# Patient Record
Sex: Male | Born: 1965
Health system: Southern US, Community
[De-identification: ages and names within clinical notes are randomized; demographics above are authoritative.]

## PROBLEM LIST (undated history)

## (undated) DIAGNOSIS — K219 Gastro-esophageal reflux disease without esophagitis: Secondary | ICD-10-CM

## (undated) DIAGNOSIS — J45909 Unspecified asthma, uncomplicated: Secondary | ICD-10-CM

## (undated) DIAGNOSIS — T7840XA Allergy, unspecified, initial encounter: Secondary | ICD-10-CM

## (undated) DIAGNOSIS — E785 Hyperlipidemia, unspecified: Secondary | ICD-10-CM

## (undated) DIAGNOSIS — E039 Hypothyroidism, unspecified: Secondary | ICD-10-CM

## (undated) HISTORY — DX: Unspecified asthma, uncomplicated: J45.909

## (undated) HISTORY — DX: Allergy, unspecified, initial encounter: T78.40XA

## (undated) HISTORY — DX: Hyperlipidemia, unspecified: E78.5

## (undated) HISTORY — PX: URETHRA SURGERY: SHX824

## (undated) HISTORY — DX: Hypothyroidism, unspecified: E03.9

## (undated) HISTORY — DX: Gastro-esophageal reflux disease without esophagitis: K21.9

---

## 1999-01-05 ENCOUNTER — Encounter: Admission: RE | Admit: 1999-01-05 | Discharge: 1999-04-05 | Payer: Self-pay | Admitting: Internal Medicine

## 1999-11-16 ENCOUNTER — Encounter: Admission: RE | Admit: 1999-11-16 | Discharge: 2000-02-14 | Payer: Self-pay | Admitting: Internal Medicine

## 2003-01-15 ENCOUNTER — Encounter: Admission: RE | Admit: 2003-01-15 | Discharge: 2003-04-15 | Payer: Self-pay | Admitting: Endocrinology

## 2004-04-08 ENCOUNTER — Ambulatory Visit: Payer: Self-pay | Admitting: Internal Medicine

## 2004-06-13 ENCOUNTER — Emergency Department (HOSPITAL_COMMUNITY): Admission: EM | Admit: 2004-06-13 | Discharge: 2004-06-13 | Payer: Self-pay | Admitting: Family Medicine

## 2004-07-08 ENCOUNTER — Encounter: Admission: RE | Admit: 2004-07-08 | Discharge: 2004-07-08 | Payer: Self-pay | Admitting: Occupational Medicine

## 2004-12-21 ENCOUNTER — Ambulatory Visit: Payer: Self-pay | Admitting: Family Medicine

## 2005-04-22 ENCOUNTER — Ambulatory Visit: Payer: Self-pay | Admitting: Family Medicine

## 2005-06-04 ENCOUNTER — Ambulatory Visit: Payer: Self-pay | Admitting: Family Medicine

## 2005-06-07 ENCOUNTER — Ambulatory Visit: Payer: Self-pay | Admitting: Family Medicine

## 2005-06-08 ENCOUNTER — Ambulatory Visit: Payer: Self-pay | Admitting: Family Medicine

## 2005-06-09 ENCOUNTER — Ambulatory Visit: Payer: Self-pay | Admitting: Family Medicine

## 2005-06-10 ENCOUNTER — Ambulatory Visit: Payer: Self-pay | Admitting: Family Medicine

## 2005-06-11 ENCOUNTER — Ambulatory Visit: Payer: Self-pay | Admitting: Family Medicine

## 2005-08-26 ENCOUNTER — Ambulatory Visit: Payer: Self-pay | Admitting: Family Medicine

## 2005-08-31 ENCOUNTER — Ambulatory Visit: Payer: Self-pay | Admitting: Family Medicine

## 2006-10-21 ENCOUNTER — Encounter: Admission: RE | Admit: 2006-10-21 | Discharge: 2006-10-21 | Payer: Self-pay | Admitting: Occupational Medicine

## 2006-12-09 ENCOUNTER — Ambulatory Visit: Payer: Self-pay | Admitting: Family Medicine

## 2006-12-09 DIAGNOSIS — J45909 Unspecified asthma, uncomplicated: Secondary | ICD-10-CM | POA: Insufficient documentation

## 2006-12-09 DIAGNOSIS — E785 Hyperlipidemia, unspecified: Secondary | ICD-10-CM

## 2006-12-09 DIAGNOSIS — E119 Type 2 diabetes mellitus without complications: Secondary | ICD-10-CM

## 2006-12-09 DIAGNOSIS — E039 Hypothyroidism, unspecified: Secondary | ICD-10-CM

## 2006-12-09 DIAGNOSIS — K219 Gastro-esophageal reflux disease without esophagitis: Secondary | ICD-10-CM

## 2006-12-12 ENCOUNTER — Encounter: Payer: Self-pay | Admitting: Family Medicine

## 2006-12-12 LAB — CONVERTED CEMR LAB
AST: 34 units/L (ref 0–37)
Albumin: 3.9 g/dL (ref 3.5–5.2)
Alkaline Phosphatase: 49 units/L (ref 39–117)
BUN: 11 mg/dL (ref 6–23)
Basophils Absolute: 0 10*3/uL (ref 0.0–0.1)
Bilirubin, Direct: 0.2 mg/dL (ref 0.0–0.3)
CO2: 30 meq/L (ref 19–32)
Chloride: 102 meq/L (ref 96–112)
Creatinine, Ser: 0.7 mg/dL (ref 0.4–1.5)
Eosinophils Absolute: 0.4 10*3/uL (ref 0.0–0.6)
GFR calc Af Amer: 160 mL/min
GFR calc non Af Amer: 132 mL/min
HCT: 43.1 % (ref 39.0–52.0)
Hemoglobin: 15.3 g/dL (ref 13.0–17.0)
Lymphocytes Relative: 20.6 % (ref 12.0–46.0)
MCHC: 35.5 g/dL (ref 30.0–36.0)
Neutro Abs: 3.1 10*3/uL (ref 1.4–7.7)
Platelets: 198 10*3/uL (ref 150–400)
RBC: 4.66 M/uL (ref 4.22–5.81)
TSH: 1.64 microintl units/mL (ref 0.35–5.50)
Total Bilirubin: 0.7 mg/dL (ref 0.3–1.2)
WBC: 5 10*3/uL (ref 4.5–10.5)

## 2007-04-21 ENCOUNTER — Telehealth: Payer: Self-pay | Admitting: Family Medicine

## 2007-05-08 ENCOUNTER — Telehealth (INDEPENDENT_AMBULATORY_CARE_PROVIDER_SITE_OTHER): Payer: Self-pay | Admitting: *Deleted

## 2007-12-19 ENCOUNTER — Ambulatory Visit: Payer: Self-pay | Admitting: Family Medicine

## 2007-12-20 ENCOUNTER — Ambulatory Visit: Payer: Self-pay | Admitting: Family Medicine

## 2007-12-20 LAB — CONVERTED CEMR LAB
Bilirubin Urine: NEGATIVE
Nitrite: NEGATIVE
Protein, U semiquant: NEGATIVE
Specific Gravity, Urine: >=1.03
Urobilinogen, UA: 0.2
pH: 5

## 2007-12-22 ENCOUNTER — Telehealth: Payer: Self-pay | Admitting: Family Medicine

## 2007-12-22 LAB — CONVERTED CEMR LAB
AST: 43 units/L — ABNORMAL HIGH (ref 0–37)
Alkaline Phosphatase: 48 units/L (ref 39–117)
Basophils Relative: 0.4 % (ref 0.0–3.0)
Bilirubin, Direct: 0.1 mg/dL (ref 0.0–0.3)
Calcium: 9.3 mg/dL (ref 8.4–10.5)
Chloride: 104 meq/L (ref 96–112)
Creatinine, Ser: 0.8 mg/dL (ref 0.4–1.5)
Creatinine,U: 249.7 mg/dL
GFR calc Af Amer: 136 mL/min
Glucose, Bld: 186 mg/dL — ABNORMAL HIGH (ref 70–99)
HDL: 39.5 mg/dL (ref >39.0)
Hemoglobin: 16.2 g/dL (ref 13.0–17.0)
MCHC: 35.1 g/dL (ref 30.0–36.0)
MCV: 91.7 fL (ref 78.0–100.0)
Neutrophils Relative %: 63.6 % (ref 43.0–77.0)
Platelets: 192 10*3/uL (ref 150–400)
Potassium: 4 meq/L (ref 3.5–5.1)
RDW: 12.3 % (ref 11.5–14.6)
Sodium: 141 meq/L (ref 135–145)
TSH: 6.66 microintl units/mL — ABNORMAL HIGH (ref 0.35–5.50)
Total Bilirubin: 1.3 mg/dL — ABNORMAL HIGH (ref 0.3–1.2)
Total Protein: 7 g/dL (ref 6.0–8.3)
WBC: 5.2 10*3/uL (ref 4.5–10.5)

## 2008-05-13 ENCOUNTER — Encounter: Payer: Self-pay | Admitting: Family Medicine

## 2008-05-15 ENCOUNTER — Encounter: Payer: Self-pay | Admitting: Family Medicine

## 2008-10-20 ENCOUNTER — Emergency Department (HOSPITAL_COMMUNITY): Admission: EM | Admit: 2008-10-20 | Discharge: 2008-10-20 | Payer: Self-pay | Admitting: Emergency Medicine

## 2009-02-19 ENCOUNTER — Ambulatory Visit: Payer: Self-pay | Admitting: Family Medicine

## 2009-03-17 ENCOUNTER — Encounter: Payer: Self-pay | Admitting: Family Medicine

## 2009-07-03 ENCOUNTER — Ambulatory Visit: Payer: Self-pay | Admitting: Family Medicine

## 2009-07-10 ENCOUNTER — Telehealth: Payer: Self-pay | Admitting: Family Medicine

## 2009-10-27 ENCOUNTER — Ambulatory Visit: Payer: Self-pay | Admitting: Family Medicine

## 2010-03-19 NOTE — Assessment & Plan Note (Signed)
Summary: vomiting/dm   Vital Signs:  Patient profile:   45 year old male Weight:      210 pounds O2 Sat:      99 % Temp:     98.7 degrees F Pulse rate:   109 / minute BP sitting:   130 / 82  (left arm)  Vitals Entered By: Pura Spice, RN (October 27, 2009 11:11 AM) CC: discomfort with throat since sat. cough non productive.    History of Present Illness: Here for one week of chest congestion, wheezing, PND, and a dry cough. No fever.   Allergies (verified): No Known Drug Allergies  Past History:  Past Medical History: Reviewed history from 12/09/2006 and no changes required. Asthma Diabetes mellitus, type II GERD Hypothyroidism Hyperlipidemia  Review of Systems  The patient denies anorexia, fever, weight loss, weight gain, vision loss, decreased hearing, hoarseness, chest pain, syncope, peripheral edema, headaches, hemoptysis, abdominal pain, melena, hematochezia, severe indigestion/heartburn, hematuria, incontinence, genital sores, muscle weakness, suspicious skin lesions, transient blindness, difficulty walking, depression, unusual weight change, abnormal bleeding, enlarged lymph nodes, angioedema, breast masses, and testicular masses.    Physical Exam  General:  Well-developed,well-nourished,in no acute distress; alert,appropriate and cooperative throughout examination Head:  Normocephalic and atraumatic without obvious abnormalities. No apparent alopecia or balding. Eyes:  No corneal or conjunctival inflammation noted. EOMI. Perrla. Funduscopic exam benign, without hemorrhages, exudates or papilledema. Vision grossly normal. Ears:  External ear exam shows no significant lesions or deformities.  Otoscopic examination reveals clear canals, tympanic membranes are intact bilaterally without bulging, retraction, inflammation or discharge. Hearing is grossly normal bilaterally. Nose:  External nasal examination shows no deformity or inflammation. Nasal mucosa are pink and  moist without lesions or exudates. Mouth:  Oral mucosa and oropharynx without lesions or exudates.  Teeth in good repair. Neck:  No deformities, masses, or tenderness noted. Lungs:  Normal respiratory effort, chest expands symmetrically. Lungs are clear to auscultation, no crackles or wheezes.   Impression & Recommendations:  Problem # 1:  ACUTE BRONCHITIS (ICD-466.0)  The following medications were removed from the medication list:    Proventil 90 Mcg/act Aers (Albuterol) .Marland Kitchen... 2 puffs every 4 hours as needed shortness of breath His updated medication list for this problem includes:    Hydromet 5-1.5 Mg/33ml Syrp (Hydrocodone-homatropine) .Marland Kitchen... 1 tsp q 4 hours as needed cough    Ventolin Hfa 108 (90 Base) Mcg/act Aers (Albuterol sulfate) .Marland Kitchen... 2 puffs q 4 hours as needed    Levaquin 500 Mg Tabs (Levofloxacin) ..... Once daily  Complete Medication List: 1)  Synthroid 150 Mcg Tabs (Levothyroxine sodium) .... One by mouth daily 2)  Metformin Hcl 1000 Mg Tabs (Metformin hcl) .... Two times a day 3)  Epipen 2-pak 0.3 Mg/0.49ml (1:1000) Devi (Epinephrine hcl (anaphylaxis)) .... As needed 4)  Accu-chek Active Strp (Glucose blood) .... As directed 5)  Pravastatin Sodium 40 Mg Tabs (Pravastatin sodium) .... Once daily 6)  Hydromet 5-1.5 Mg/41ml Syrp (Hydrocodone-homatropine) .Marland Kitchen.. 1 tsp q 4 hours as needed cough 7)  Ventolin Hfa 108 (90 Base) Mcg/act Aers (Albuterol sulfate) .... 2 puffs q 4 hours as needed 8)  Levaquin 500 Mg Tabs (Levofloxacin) .... Once daily  Patient Instructions: 1)  Please schedule a follow-up appointment as needed .  Prescriptions: LEVAQUIN 500 MG TABS (LEVOFLOXACIN) once daily  #28 x 0   Entered and Authorized by:   Nelwyn Salisbury MD   Signed by:   Nelwyn Salisbury MD on 10/27/2009   Method  used:   Print then Give to Patient   RxID:   [id number] HYDROMET 5-1.5 MG/5ML SYRP (HYDROCODONE-HOMATROPINE) 1 tsp q 4 hours as needed cough  #240 x 0   Entered and Authorized  by:   Nelwyn Salisbury MD   Signed by:   Nelwyn Salisbury MD on 10/27/2009   Method used:   Print then Give to Patient   RxID:   [id number] VENTOLIN HFA 108 (90 BASE) MCG/ACT AERS (ALBUTEROL SULFATE) 2 puffs q 4 hours as needed  #1 x 11   Entered and Authorized by:   Nelwyn Salisbury MD   Signed by:   Nelwyn Salisbury MD on 10/27/2009   Method used:   Print then Give to Patient   RxID:   [id number]

## 2010-03-19 NOTE — Letter (Signed)
Summary: Diabetic Eye Evaluation Report/[facility]  Diabetic Eye Evaluation Report/[facility]   Imported By: Maryln Gottron 03/19/2009 10:25:47  _____________________________________________________________________  External Attachment:    Type:   Image     Comment:   External Document

## 2010-03-19 NOTE — Assessment & Plan Note (Signed)
Summary: sob/cough/dm   Vital Signs:  Patient profile:   45 year old male Weight:      214 pounds O2 Sat:      98 % on Room air Temp:     97.8 degrees F oral Pulse rate:   94 / minute BP sitting:   112 / 68  (left arm) Cuff size:   large  Vitals Entered By: Alfred Levins, CMA (February 19, 2009 10:47 AM)  O2 Flow:  Room air CC: barking cough, chest tightness x2 days   History of Present Illness: Here for 4 days of chest congestion and coughing up green sputum. Low grade fevers. On Mucinex.  Current Medications (verified): 1)  Synthroid 150 Mcg Tabs (Levothyroxine Sodium) .... One By Mouth Daily 2)  Metformin Hcl 1000 Mg Tabs (Metformin Hcl) .... Two Times A Day 3)  Proventil 90 Mcg/act  Aers (Albuterol) .... 2 Puffs Every 4 Hours As Needed Shortness of Breath 4)  Epipen 2-Pak 0.3 Mg/0.67ml (1:1000)  Devi (Epinephrine Hcl (Anaphylaxis)) .... As Needed 5)  Accu-Chek Active  Strp (Glucose Blood) .... As Directed 6)  Pravastatin Sodium 40 Mg Tabs (Pravastatin Sodium) .... Once Daily  Allergies (verified): No Known Drug Allergies  Past History:  Past Medical History: Reviewed history from 12/09/2006 and no changes required. Asthma Diabetes mellitus, type II GERD Hypothyroidism Hyperlipidemia  Review of Systems  The patient denies anorexia, fever, weight loss, weight gain, vision loss, decreased hearing, hoarseness, chest pain, syncope, dyspnea on exertion, peripheral edema, headaches, hemoptysis, abdominal pain, melena, hematochezia, severe indigestion/heartburn, hematuria, incontinence, genital sores, muscle weakness, suspicious skin lesions, transient blindness, difficulty walking, depression, unusual weight change, abnormal bleeding, enlarged lymph nodes, angioedema, breast masses, and testicular masses.    Physical Exam  General:  Well-developed,well-nourished,in no acute distress; alert,appropriate and cooperative throughout examination Head:  Normocephalic and  atraumatic without obvious abnormalities. No apparent alopecia or balding. Eyes:  No corneal or conjunctival inflammation noted. EOMI. Perrla. Funduscopic exam benign, without hemorrhages, exudates or papilledema. Vision grossly normal. Ears:  External ear exam shows no significant lesions or deformities.  Otoscopic examination reveals clear canals, tympanic membranes are intact bilaterally without bulging, retraction, inflammation or discharge. Hearing is grossly normal bilaterally. Nose:  External nasal examination shows no deformity or inflammation. Nasal mucosa are pink and moist without lesions or exudates. Mouth:  Oral mucosa and oropharynx without lesions or exudates.  Teeth in good repair. Neck:  No deformities, masses, or tenderness noted. Lungs:  Normal respiratory effort, chest expands symmetrically. Lungs are clear to auscultation, no crackles or wheezes.   Impression & Recommendations:  Problem # 1:  ACUTE BRONCHITIS (ICD-466.0)  His updated medication list for this problem includes:    Proventil 90 Mcg/act Aers (Albuterol) .Marland Kitchen... 2 puffs every 4 hours as needed shortness of breath    Augmentin 875-125 Mg Tabs (Amoxicillin-pot clavulanate) .Marland Kitchen..Marland Kitchen Two times a day    Hydromet 5-1.5 Mg/79ml Syrp (Hydrocodone-homatropine) .Marland Kitchen... 1 tsp q 4 hours as needed cough  Complete Medication List: 1)  Synthroid 150 Mcg Tabs (Levothyroxine sodium) .... One by mouth daily 2)  Metformin Hcl 1000 Mg Tabs (Metformin hcl) .... Two times a day 3)  Proventil 90 Mcg/act Aers (Albuterol) .... 2 puffs every 4 hours as needed shortness of breath 4)  Epipen 2-pak 0.3 Mg/0.58ml (1:1000) Devi (Epinephrine hcl (anaphylaxis)) .... As needed 5)  Accu-chek Active Strp (Glucose blood) .... As directed 6)  Pravastatin Sodium 40 Mg Tabs (Pravastatin sodium) .... Once daily 7)  Augmentin 875-125 Mg Tabs (Amoxicillin-pot clavulanate) .... Two times a day 8)  Hydromet 5-1.5 Mg/59ml Syrp (Hydrocodone-homatropine) .Marland Kitchen.. 1 tsp  q 4 hours as needed cough  Patient Instructions: 1)  Please schedule a follow-up appointment as needed . Out of work yesterday and today. Prescriptions: PRAVASTATIN SODIUM 40 MG TABS (PRAVASTATIN SODIUM) once daily  #30 x 11   Entered and Authorized by:   Nelwyn Salisbury MD   Signed by:   Nelwyn Salisbury MD on 02/19/2009   Method used:   Print then Give to Patient   RxID:   [id number] METFORMIN HCL 1000 MG TABS (METFORMIN HCL) two times a day  #60 x 11   Entered and Authorized by:   Nelwyn Salisbury MD   Signed by:   Nelwyn Salisbury MD on 02/19/2009   Method used:   Print then Give to Patient   RxID:   [id number] SYNTHROID 150 MCG TABS (LEVOTHYROXINE SODIUM) one by mouth daily  #30 x 11   Entered and Authorized by:   Nelwyn Salisbury MD   Signed by:   Nelwyn Salisbury MD on 02/19/2009   Method used:   Print then Give to Patient   RxID:   [id number] HYDROMET 5-1.5 MG/5ML SYRP (HYDROCODONE-HOMATROPINE) 1 tsp q 4 hours as needed cough  #240 x 0   Entered and Authorized by:   Nelwyn Salisbury MD   Signed by:   Nelwyn Salisbury MD on 02/19/2009   Method used:   Print then Give to Patient   RxID:   [id number] AUGMENTIN 875-125 MG TABS (AMOXICILLIN-POT CLAVULANATE) two times a day  #20 x 0   Entered and Authorized by:   Nelwyn Salisbury MD   Signed by:   Nelwyn Salisbury MD on 02/19/2009   Method used:   Print then Give to Patient   RxID:   [id number]

## 2010-03-19 NOTE — Assessment & Plan Note (Signed)
Summary: ? bronchitis? /dm   Vital Signs:  Patient profile:   45 year old male Weight:      214 pounds O2 Sat:      99 % on Room air Temp:     98.0 degrees F oral BP sitting:   160 / 86  (left arm) Cuff size:   regular  Vitals Entered By: Raechel Ache, RN (Jul 03, 2009 11:33 AM)  O2 Flow:  Room air CC: C/o sinus cong, dry cough and sore chest.   History of Present Illness: Here for 3 days of SOB, wheezing, PND, and coughing up green sputum. No fever. Using his inhaler frequently.   Allergies: No Known Drug Allergies  Past History:  Past Medical History: Reviewed history from 12/09/2006 and no changes required. Asthma Diabetes mellitus, type II GERD Hypothyroidism Hyperlipidemia  Review of Systems  The patient denies anorexia, fever, weight loss, weight gain, vision loss, decreased hearing, hoarseness, chest pain, syncope, peripheral edema, headaches, hemoptysis, abdominal pain, melena, hematochezia, severe indigestion/heartburn, hematuria, incontinence, genital sores, muscle weakness, suspicious skin lesions, transient blindness, difficulty walking, depression, unusual weight change, abnormal bleeding, enlarged lymph nodes, angioedema, breast masses, and testicular masses.    Physical Exam  General:  mildly dyspneic  Head:  Normocephalic and atraumatic without obvious abnormalities. No apparent alopecia or balding. Eyes:  No corneal or conjunctival inflammation noted. EOMI. Perrla. Funduscopic exam benign, without hemorrhages, exudates or papilledema. Vision grossly normal. Ears:  External ear exam shows no significant lesions or deformities.  Otoscopic examination reveals clear canals, tympanic membranes are intact bilaterally without bulging, retraction, inflammation or discharge. Hearing is grossly normal bilaterally. Nose:  External nasal examination shows no deformity or inflammation. Nasal mucosa are pink and moist without lesions or exudates. Mouth:  Oral mucosa  and oropharynx without lesions or exudates.  Teeth in good repair. Neck:  No deformities, masses, or tenderness noted. Lungs:  soft wheezes and rhonchi, no rales Heart:  Normal rate and regular rhythm. S1 and S2 normal without gallop, murmur, click, rub or other extra sounds.   Impression & Recommendations:  Problem # 1:  ACUTE BRONCHITIS (ICD-466.0)  His updated medication list for this problem includes:    Proventil 90 Mcg/act Aers (Albuterol) .Marland Kitchen... 2 puffs every 4 hours as needed shortness of breath    Hydromet 5-1.5 Mg/53ml Syrp (Hydrocodone-homatropine) .Marland Kitchen... 1 tsp q 4 hours as needed cough    Augmentin 875-125 Mg Tabs (Amoxicillin-pot clavulanate) .Marland Kitchen..Marland Kitchen Two times a day  Orders: Depo- Medrol 80mg  (J1040) Admin of Therapeutic Inj  intramuscular or subcutaneous (16109)  Problem # 2:  ASTHMA (ICD-493.90)  His updated medication list for this problem includes:    Proventil 90 Mcg/act Aers (Albuterol) .Marland Kitchen... 2 puffs every 4 hours as needed shortness of breath    Prednisone (pak) 10 Mg Tabs (Prednisone) .Marland Kitchen... As directed for 12 days  Complete Medication List: 1)  Synthroid 150 Mcg Tabs (Levothyroxine sodium) .... One by mouth daily 2)  Metformin Hcl 1000 Mg Tabs (Metformin hcl) .... Two times a day 3)  Proventil 90 Mcg/act Aers (Albuterol) .... 2 puffs every 4 hours as needed shortness of breath 4)  Epipen 2-pak 0.3 Mg/0.17ml (1:1000) Devi (Epinephrine hcl (anaphylaxis)) .... As needed 5)  Accu-chek Active Strp (Glucose blood) .... As directed 6)  Pravastatin Sodium 40 Mg Tabs (Pravastatin sodium) .... Once daily 7)  Hydromet 5-1.5 Mg/76ml Syrp (Hydrocodone-homatropine) .Marland Kitchen.. 1 tsp q 4 hours as needed cough 8)  Prednisone (pak) 10 Mg Tabs (Prednisone) .Marland KitchenMarland KitchenMarland Kitchen  As directed for 12 days 9)  Augmentin 875-125 Mg Tabs (Amoxicillin-pot clavulanate) .... Two times a day  Patient Instructions: 1)  Please schedule a follow-up appointment as needed . Off work today and  tomorrow Prescriptions: AUGMENTIN 875-125 MG TABS (AMOXICILLIN-POT CLAVULANATE) two times a day  #20 x 0   Entered and Authorized by:   Nelwyn Salisbury MD   Signed by:   Nelwyn Salisbury MD on 07/03/2009   Method used:   Electronically to        [facility]  [phone]* (retail)       [address]       [place], [place]  [address]       Ph: [phone] or [phone]       Fax: [phone]   RxID:   [id number] PREDNISONE (PAK) 10 MG TABS (PREDNISONE) as directed for 12 days  #1 x 0   Entered and Authorized by:   Nelwyn Salisbury MD   Signed by:   Nelwyn Salisbury MD on 07/03/2009   Method used:   Electronically to        [facility]  [phone]* (retail)       [address]       [place], [place]  [address]       Ph: [phone] or [phone]       Fax: [phone]   RxID:   [id number]    Medication Administration  Injection # 1:    Medication: Depo- Medrol 80mg     Diagnosis: ACUTE BRONCHITIS (ICD-466.0)    Route: IM    Site: RUOQ gluteus    Exp Date: 11/13    Lot #: [id number]    Mfr: [employer]    Comments: 160mg  given    Patient tolerated injection without complications    Given by: Raechel Ache, RN (Jul 03, 2009 12:04 PM)  Orders Added: 1)  Est. Patient Level IV [23762] 2)  Depo- Medrol 80mg  [J1040] 3)  Admin of Therapeutic Inj  intramuscular or subcutaneous [83151]

## 2010-03-19 NOTE — Progress Notes (Signed)
Summary: elevated BSs on Prednisone  Phone Note Call from Patient   Caller: Patient Call For: Ryan Salisbury MD Summary of Call: [phone] Pt. is calling to let Dr. Clent Decker know he is taking his prednisone, but his BS has been as high as 460.  He didn't take the Prednisone last night, and his BS today is 270.  Wants to know if he can stop the Prednisone ASAP or if he should taper?   Initial call taken by: Lynann Beaver CMA,  Jul 10, 2009 9:52 AM  Follow-up for Phone Call        he needs to taper off this according to the directions. His sugars should settle down now that he is off the high doses. drink lots of water Follow-up by: Ryan Salisbury MD,  Jul 10, 2009 10:39 AM  Additional Follow-up for Phone Call Additional follow up Details #1::        Pt given Dr. Claris Che recommendations. Additional Follow-up by: Lynann Beaver CMA,  Jul 10, 2009 10:55 AM

## 2010-03-19 NOTE — Letter (Signed)
Summary: Physical Exam/[facility]  Physical Exam/[facility]   Imported By: Maryln Gottron 02/21/2009 13:28:50  _____________________________________________________________________  External Attachment:    Type:   Image     Comment:   External Document

## 2010-03-25 ENCOUNTER — Other Ambulatory Visit: Payer: Self-pay | Admitting: Family Medicine

## 2010-03-25 DIAGNOSIS — E039 Hypothyroidism, unspecified: Secondary | ICD-10-CM

## 2010-03-25 DIAGNOSIS — E119 Type 2 diabetes mellitus without complications: Secondary | ICD-10-CM

## 2010-03-26 NOTE — Telephone Encounter (Signed)
[facility] called and rx given

## 2010-11-28 ENCOUNTER — Other Ambulatory Visit: Payer: Self-pay | Admitting: Family Medicine

## 2011-03-31 ENCOUNTER — Other Ambulatory Visit: Payer: Self-pay | Admitting: Family Medicine

## 2011-06-01 ENCOUNTER — Other Ambulatory Visit: Payer: Self-pay | Admitting: Family Medicine

## 2011-06-01 NOTE — Telephone Encounter (Signed)
Can we refill this? 

## 2011-06-03 NOTE — Telephone Encounter (Signed)
Call in 30 days of each with no further rf. He needs an OV soon

## 2011-07-12 ENCOUNTER — Other Ambulatory Visit: Payer: Self-pay | Admitting: Family Medicine

## 2011-07-13 NOTE — Telephone Encounter (Signed)
Call in 30 days for each with no further refills. He needs an OV soon

## 2011-07-13 NOTE — Telephone Encounter (Signed)
Please see note on last script. 

## 2011-09-27 ENCOUNTER — Telehealth: Payer: Self-pay | Admitting: Family Medicine

## 2011-09-27 NOTE — Telephone Encounter (Signed)
Pt req to get A1C and all labs for metformin and synthroid and cholesterol meds. Pls order labs.

## 2011-09-27 NOTE — Telephone Encounter (Signed)
Please advise - last seen 201 - no current appt on the schedule.

## 2011-09-28 NOTE — Telephone Encounter (Signed)
We have not seen him in 2 years. He needs to make an OV and come in fasting that day for labs

## 2011-09-29 NOTE — Telephone Encounter (Signed)
Called and lft pt a vm to sch ov as noted. Waiting on call back.

## 2011-09-30 NOTE — Telephone Encounter (Signed)
Pt called and sch as fasting ov for 10/07/11 at 8:30am as noted.

## 2011-10-07 ENCOUNTER — Ambulatory Visit (INDEPENDENT_AMBULATORY_CARE_PROVIDER_SITE_OTHER): Payer: BC Managed Care – PPO | Admitting: Family Medicine

## 2011-10-07 ENCOUNTER — Encounter: Payer: Self-pay | Admitting: Family Medicine

## 2011-10-07 VITALS — BP 130/86 | HR 79 | Temp 98.2°F | Wt 195.0 lb

## 2011-10-07 DIAGNOSIS — E119 Type 2 diabetes mellitus without complications: Secondary | ICD-10-CM

## 2011-10-07 DIAGNOSIS — E785 Hyperlipidemia, unspecified: Secondary | ICD-10-CM

## 2011-10-07 DIAGNOSIS — J45909 Unspecified asthma, uncomplicated: Secondary | ICD-10-CM

## 2011-10-07 DIAGNOSIS — E039 Hypothyroidism, unspecified: Secondary | ICD-10-CM

## 2011-10-07 LAB — HEPATIC FUNCTION PANEL
ALT: 24 U/L (ref 0–53)
AST: 21 U/L (ref 0–37)
Alkaline Phosphatase: 48 U/L (ref 39–117)
Bilirubin, Direct: 0.1 mg/dL (ref 0.0–0.3)
Total Bilirubin: 1 mg/dL (ref 0.3–1.2)
Total Protein: 7.2 g/dL (ref 6.0–8.3)

## 2011-10-07 LAB — HEMOGLOBIN A1C: Hgb A1c MFr Bld: 9.3 % — ABNORMAL HIGH (ref 4.6–6.5)

## 2011-10-07 LAB — BASIC METABOLIC PANEL
BUN: 16 mg/dL (ref 6–23)
CO2: 27 mEq/L (ref 19–32)
Creatinine, Ser: 0.7 mg/dL (ref 0.4–1.5)
Sodium: 135 mEq/L (ref 135–145)

## 2011-10-07 LAB — POCT URINALYSIS DIPSTICK
Leukocytes, UA: NEGATIVE
Protein, UA: NEGATIVE

## 2011-10-07 LAB — CBC WITH DIFFERENTIAL/PLATELET
Basophils Absolute: 0 10*3/uL (ref 0.0–0.1)
Basophils Relative: 0.5 % (ref 0.0–3.0)
HCT: 46.3 % (ref 39.0–52.0)
Lymphocytes Relative: 27.1 % (ref 12.0–46.0)
Lymphs Abs: 1.3 10*3/uL (ref 0.7–4.0)
Monocytes Absolute: 0.5 10*3/uL (ref 0.1–1.0)
Monocytes Relative: 9.5 % (ref 3.0–12.0)
Neutro Abs: 2.9 10*3/uL (ref 1.4–7.7)
Neutrophils Relative %: 61.7 % (ref 43.0–77.0)
RBC: 4.99 Mil/uL (ref 4.22–5.81)

## 2011-10-07 LAB — TSH: TSH: 3.83 u[IU]/mL (ref 0.35–5.50)

## 2011-10-07 LAB — MICROALBUMIN / CREATININE URINE RATIO: Microalb, Ur: 0.6 mg/dL (ref 0.0–1.9)

## 2011-10-07 LAB — LIPID PANEL
HDL: 49.5 mg/dL (ref >39.00)
LDL Cholesterol: 124 mg/dL — ABNORMAL HIGH (ref 0–99)
Total CHOL/HDL Ratio: 4
Triglycerides: 107 mg/dL (ref 0.0–149.0)

## 2011-10-07 MED ORDER — METFORMIN HCL 1000 MG PO TABS: 1000.0000 mg | ORAL_TABLET | Freq: Two times a day (BID) | ORAL | Status: DC

## 2011-10-07 MED ORDER — LEVOTHYROXINE SODIUM 150 MCG PO TABS: 150.0000 ug | ORAL_TABLET | Freq: Every day | ORAL | Status: DC

## 2011-10-07 MED ORDER — ALBUTEROL SULFATE HFA 108 (90 BASE) MCG/ACT IN AERS: 2.0000 | INHALATION_SPRAY | RESPIRATORY_TRACT | Status: DC | PRN

## 2011-10-07 NOTE — Progress Notes (Signed)
  Subjective:    Patient ID: Ryan Decker, male    DOB: 27-Jun-1965, 46 y.o.   MRN: [id number]  HPI Here to follow up. He feels fine and has lost a little weight. His am fasting glucoses have been from 120 to 150.    Review of Systems  Constitutional: Negative.   Respiratory: Negative.   Cardiovascular: Negative.        Objective:   Physical Exam  Constitutional: He appears well-developed and well-nourished.  Neck: No thyromegaly present.  Cardiovascular: Normal rate, regular rhythm, normal heart sounds and intact distal pulses.   Pulmonary/Chest: Effort normal and breath sounds normal.  Lymphadenopathy:    He has no cervical adenopathy.          Assessment & Plan:  Get fasting labs.

## 2011-10-11 MED ORDER — GLIPIZIDE 10 MG PO TABS: 10.0000 mg | ORAL_TABLET | Freq: Two times a day (BID) | ORAL | Status: DC

## 2011-10-11 NOTE — Addendum Note (Signed)
Addended by: Aniceto Boss A on: 10/11/2011 02:33 PM   Modules accepted: Orders

## 2011-10-11 NOTE — Progress Notes (Signed)
Quick Note:  I left voice message for pt to return my call. ______ 

## 2011-10-11 NOTE — Progress Notes (Signed)
Quick Note:  I spoke with pt and also sent new script e-scribe. ______

## 2011-11-08 ENCOUNTER — Telehealth: Payer: Self-pay | Admitting: Family Medicine

## 2011-11-08 MED ORDER — GLUCOSE BLOOD VI STRP: ORAL_STRIP | Status: DC

## 2011-11-08 NOTE — Telephone Encounter (Signed)
Pt needs refill on one touch test strips.

## 2011-11-08 NOTE — Telephone Encounter (Signed)
The note on script said test up to 6 times per day, per Dr. Clent Ridges pt should test once per day. I called pt and left a voice message with this information and sent script e-scribe.

## 2011-11-10 ENCOUNTER — Telehealth: Payer: Self-pay | Admitting: Family Medicine

## 2011-11-10 NOTE — Telephone Encounter (Signed)
Pt called and left a voice message. He is concerned because his blood sugar levels have been between 90-115 & a few times below 90. He is currently taking 2 diabetic medications and want to know if he can discontinue the Metformin? Also he was requesting to check his levels 6 times a day and you only approved for him to check it once a day.

## 2011-11-11 NOTE — Telephone Encounter (Signed)
I left voice message for pt to return my call. 

## 2011-11-11 NOTE — Telephone Encounter (Signed)
I spoke with pt  

## 2011-11-11 NOTE — Telephone Encounter (Signed)
First, he does not need to check his glucoses more than once a day. Second, I agree we can try stopping one of his meds but we should stop the Glipizide, but stay on the metformin. This is the most important one.

## 2012-01-15 ENCOUNTER — Emergency Department (INDEPENDENT_AMBULATORY_CARE_PROVIDER_SITE_OTHER)
Admission: EM | Admit: 2012-01-15 | Discharge: 2012-01-15 | Disposition: A | Discharge status: Home / Self Care | Payer: BC Managed Care – PPO | Source: Home / Self Care | Attending: Emergency Medicine | Admitting: Emergency Medicine

## 2012-01-15 ENCOUNTER — Encounter (HOSPITAL_COMMUNITY): Payer: Self-pay | Admitting: Emergency Medicine

## 2012-01-15 DIAGNOSIS — IMO0002 Reserved for concepts with insufficient information to code with codable children: Secondary | ICD-10-CM

## 2012-01-15 DIAGNOSIS — W19XXXA Unspecified fall, initial encounter: Secondary | ICD-10-CM

## 2012-01-15 DIAGNOSIS — T148XXA Other injury of unspecified body region, initial encounter: Secondary | ICD-10-CM

## 2012-01-15 DIAGNOSIS — S20219A Contusion of unspecified front wall of thorax, initial encounter: Secondary | ICD-10-CM

## 2012-01-15 NOTE — ED Provider Notes (Signed)
History     CSN: [id number]  Arrival date & time 01/15/12  4098   None     Chief Complaint  Patient presents with  . Laceration    (Consider location/radiation/quality/duration/timing/severity/associated sxs/prior treatment) Patient is a 46 y.o. male presenting with skin laceration. The history is provided by the patient.  Laceration  The incident occurred less than 1 hour ago. The laceration is located on the right leg and face. The laceration is 4 cm (4 cm lac on upper lip, 8.5cm right leg) in size. 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 The pain is mild. The pain has been constant since onset. He reports no foreign bodies present. His tetanus status is UTD.    Past Medical History  Diagnosis Date  . Asthma   . Diabetes mellitus   . GERD (gastroesophageal reflux disease)   . Hyperlipidemia   . Hypothyroidism     History reviewed. No pertinent past surgical history.  No family history on file.  History  Substance Use Topics  . Smoking status: Never Smoker   . Smokeless tobacco: Never Used  . Alcohol Use: No      Review of Systems  Cardiovascular: Positive for chest pain.  Skin: Positive for wound.  All other systems reviewed and are negative.    Allergies  Keflex  Home Medications   Current Outpatient Rx  Name  Route  Sig  Dispense  Refill  . GLIPIZIDE 10 MG PO TABS   Oral   Take 1 tablet (10 mg total) by mouth 2 (two) times daily before a meal.   60 tablet   11   . LEVOTHYROXINE SODIUM 150 MCG PO TABS   Oral   Take 1 tablet (150 mcg total) by mouth daily.   90 tablet   3   . METFORMIN HCL 1000 MG PO TABS   Oral   Take 1 tablet (1,000 mg total) by mouth 2 (two) times daily with a meal.   180 tablet   3   . ALBUTEROL SULFATE HFA 108 (90 BASE) MCG/ACT IN AERS   Inhalation   Inhale 2 puffs into the lungs every 4 (four) hours as needed for wheezing or shortness of  breath.   1 Inhaler   11   . GLUCOSE BLOOD VI STRP      Use as instructed   100 each   11     Test once per day and diagnosis code is 250.00     BP 142/82  Pulse 87  Temp 97.9 F (36.6 C) (Oral)  Resp 16  SpO2 98%  Physical Exam  Nursing note and vitals reviewed. Constitutional: He is oriented to person, place, and time. Vital signs are normal. He appears well-developed and well-nourished. He is active and cooperative.  HENT:  Head: Normocephalic.  Eyes: Conjunctivae normal are normal. Pupils are equal, round, and reactive to light. No scleral icterus.  Neck: Trachea normal. Neck supple.  Cardiovascular: Normal rate and regular rhythm.   Pulmonary/Chest: Effort normal and breath sounds normal.  Neurological: He is alert and oriented to person, place, and time. No cranial nerve deficit or sensory deficit.  Skin: Skin is warm and dry.     Psychiatric: He has a normal mood and affect. His speech is normal and behavior is normal. Judgment and thought content normal. Cognition and memory are normal.    ED Course  LACERATION REPAIR Date/Time: 01/15/2012 7:26 PM  Performed by: Johnsie Kindred Authorized by: Johnsie Kindred Consent: Verbal consent obtained. Risks and benefits: risks, benefits and alternatives were discussed Consent given by: patient Patient understanding: patient states understanding of the procedure being performed Patient consent: the patient's understanding of the procedure matches consent given Procedure consent: procedure consent matches procedure scheduled Patient identity confirmed: verbally with patient and arm band Time out: Immediately prior to procedure a "time out" was called to verify the correct patient, procedure, equipment, support staff and site/side marked as required. Body area: head/neck (under nose and right anterior lower extremity; see notes for measurements) Tendon involvement: none Nerve involvement: none Vascular damage:  no Anesthesia method: none. Preparation: Patient was prepped and draped in the usual sterile fashion. Irrigation solution: saline Amount of cleaning: standard Wound skin closure material used: dermabond. Approximation: close Approximation difficulty: simple Dressing: gauze roll (nonadhesive and gauze placed on lower extremity wound) Patient tolerance: Patient tolerated the procedure well with no immediate complications.   (including critical care time)  Labs Reviewed - No data to display No results found.   1. Laceration   2. Fall   3. Contusion of rib       MDM  Declines xray at this time.  Follow up with primary care provider if pain persist.  Home pain medication as needed.         Johnsie Kindred, NP 01/17/12 1826

## 2012-01-15 NOTE — ED Notes (Signed)
Pt fell of a 6 feet ladder around 18:00... Mouth hit ladder as he fell and right shin scraped ladder as well... Laceration above lip is about 2cm and laceration to shin on right leg is about 5cm... Denies: head inj/trauma, loss of conscious... He is alert w/no signs of distress.

## 2012-01-18 NOTE — ED Provider Notes (Signed)
Medical screening examination/treatment/procedure(s) were performed by non-physician practitioner and as supervising physician I was immediately available for consultation/collaboration.  Leslee Home, M.D.   Reuben Likes, MD 01/18/12 [phone]

## 2012-07-31 ENCOUNTER — Telehealth: Payer: Self-pay | Admitting: Family Medicine

## 2012-07-31 MED ORDER — LEVOTHYROXINE SODIUM 150 MCG PO TABS: 150.0000 ug | ORAL_TABLET | Freq: Every day | ORAL | Status: DC

## 2012-07-31 NOTE — Telephone Encounter (Signed)
Refill request for Synthroid 150 mcg take 1 po qd and a 90 day supply. Also can we change from Mylan to Sandoz?

## 2012-07-31 NOTE — Telephone Encounter (Signed)
I sent script e-scribe. 

## 2012-07-31 NOTE — Telephone Encounter (Signed)
Okay to switch, give a one year supply

## 2012-11-08 ENCOUNTER — Other Ambulatory Visit: Payer: Self-pay | Admitting: Family Medicine

## 2012-11-21 ENCOUNTER — Telehealth: Payer: Self-pay | Admitting: Family Medicine

## 2012-11-21 NOTE — Telephone Encounter (Signed)
Pt would like to know if he could come in wed for labs to check his diabetes.  Pt not seen since 09/2011. Pt also has sinus inf he is going to be seen for, and asked to come in for those labs prior to appt. pls advise

## 2012-11-22 NOTE — Telephone Encounter (Signed)
NO, have him come in fasting the day of the appt and we will get the labs then

## 2012-11-22 NOTE — Telephone Encounter (Signed)
Pt aware/kh 

## 2012-11-24 ENCOUNTER — Encounter: Payer: Self-pay | Admitting: Family Medicine

## 2012-11-24 ENCOUNTER — Ambulatory Visit (INDEPENDENT_AMBULATORY_CARE_PROVIDER_SITE_OTHER): Payer: BC Managed Care – PPO | Admitting: Family Medicine

## 2012-11-24 VITALS — BP 120/80 | HR 94 | Temp 98.2°F | Wt 206.0 lb

## 2012-11-24 DIAGNOSIS — E785 Hyperlipidemia, unspecified: Secondary | ICD-10-CM

## 2012-11-24 DIAGNOSIS — J019 Acute sinusitis, unspecified: Secondary | ICD-10-CM

## 2012-11-24 DIAGNOSIS — E119 Type 2 diabetes mellitus without complications: Secondary | ICD-10-CM

## 2012-11-24 LAB — HEPATIC FUNCTION PANEL
ALT: 62 U/L — ABNORMAL HIGH (ref 0–53)
Albumin: 4.1 g/dL (ref 3.5–5.2)
Bilirubin, Direct: 0.1 mg/dL (ref 0.0–0.3)
Total Bilirubin: 1 mg/dL (ref 0.3–1.2)

## 2012-11-24 LAB — MICROALBUMIN / CREATININE URINE RATIO
Microalb Creat Ratio: 1 mg/g (ref 0.0–30.0)
Microalb, Ur: 1 mg/dL (ref 0.0–1.9)

## 2012-11-24 LAB — BASIC METABOLIC PANEL
Calcium: 9.1 mg/dL (ref 8.4–10.5)
Chloride: 102 mEq/L (ref 96–112)
Potassium: 4 mEq/L (ref 3.5–5.1)
Sodium: 138 mEq/L (ref 135–145)

## 2012-11-24 LAB — TSH: TSH: 0.09 u[IU]/mL — ABNORMAL LOW (ref 0.35–5.50)

## 2012-11-24 LAB — POCT URINALYSIS DIPSTICK
Blood, UA: NEGATIVE
Leukocytes, UA: NEGATIVE
Protein, UA: NEGATIVE
Urobilinogen, UA: 0.2
pH, UA: 5

## 2012-11-24 LAB — LIPID PANEL
HDL: 43.1 mg/dL (ref >39.00)
Triglycerides: 172 mg/dL — ABNORMAL HIGH (ref 0.0–149.0)
VLDL: 34.4 mg/dL (ref 0.0–40.0)

## 2012-11-24 LAB — CBC WITH DIFFERENTIAL/PLATELET
Basophils Relative: 0.5 % (ref 0.0–3.0)
Eosinophils Absolute: 0.1 10*3/uL (ref 0.0–0.7)
Eosinophils Relative: 2 % (ref 0.0–5.0)
Lymphocytes Relative: 29.2 % (ref 12.0–46.0)
Monocytes Relative: 9.3 % (ref 3.0–12.0)
Neutrophils Relative %: 59 % (ref 43.0–77.0)
Platelets: 209 10*3/uL (ref 150.0–400.0)

## 2012-11-24 LAB — HEMOGLOBIN A1C: Hgb A1c MFr Bld: 10.2 % — ABNORMAL HIGH (ref 4.6–6.5)

## 2012-11-24 LAB — LDL CHOLESTEROL, DIRECT: Direct LDL: 142.5 mg/dL

## 2012-11-24 MED ORDER — METFORMIN HCL 1000 MG PO TABS: 1000.0000 mg | ORAL_TABLET | Freq: Two times a day (BID) | ORAL | Status: DC

## 2012-11-24 MED ORDER — GLIPIZIDE 10 MG PO TABS: 10.0000 mg | ORAL_TABLET | Freq: Two times a day (BID) | ORAL | Status: DC

## 2012-11-24 MED ORDER — AZITHROMYCIN 250 MG PO TABS: ORAL_TABLET | ORAL | Status: DC

## 2012-11-24 NOTE — Progress Notes (Signed)
  Subjective:    Patient ID: Ryan Decker, male    DOB: [birth year], 47 y.o.   MRN: [id number]  HPI Here for refills and also for a possible sinus infection. For 4 days he has had sinus pressure, dizziness, PND, and ST. No cough.    Review of Systems  Constitutional: Negative.   HENT: Positive for congestion, postnasal drip and sinus pressure.   Eyes: Negative.   Respiratory: Negative.   Cardiovascular: Negative.        Objective:   Physical Exam  Constitutional: He appears well-developed and well-nourished.  HENT:  Right Ear: External ear normal.  Left Ear: External ear normal.  Nose: Nose normal.  Mouth/Throat: Oropharynx is clear and moist.  Eyes: Conjunctivae are normal.  Cardiovascular: Normal rate, regular rhythm, normal heart sounds and intact distal pulses.   Pulmonary/Chest: Effort normal and breath sounds normal.  Lymphadenopathy:    He has no cervical adenopathy.          Assessment & Plan:  Given a Zpack for the sinusitis. Get fasting labs.

## 2012-11-29 ENCOUNTER — Telehealth: Payer: Self-pay

## 2012-11-29 MED ORDER — LEVOTHYROXINE SODIUM 100 MCG PO TABS: 100.0000 ug | ORAL_TABLET | Freq: Every day | ORAL | Status: DC

## 2012-11-29 NOTE — Telephone Encounter (Signed)
Message copied by Beverely Low on Wed Nov 29, 2012  3:24 PM ------      Message from: Gershon Crane A      Created: Fri Nov 24, 2012  4:35 PM       His thyroid dose is too strong, decrease the Synthroid to 100 mcg daily. Call in a one year supply. His diabetes is way out of control. He needs to watch a strict diet and take in no more than 1500 calories a day. Take his meds daily. Recheck an A1c in 90 days ------

## 2012-11-30 ENCOUNTER — Other Ambulatory Visit: Payer: Self-pay

## 2012-11-30 MED ORDER — GLUCOSE BLOOD VI STRP: ORAL_STRIP | Status: AC

## 2012-12-06 ENCOUNTER — Ambulatory Visit (INDEPENDENT_AMBULATORY_CARE_PROVIDER_SITE_OTHER): Payer: BC Managed Care – PPO | Admitting: Family Medicine

## 2012-12-06 ENCOUNTER — Encounter: Payer: Self-pay | Admitting: Family Medicine

## 2012-12-06 VITALS — BP 134/80 | HR 100 | Temp 98.1°F | Wt 206.0 lb

## 2012-12-06 DIAGNOSIS — J019 Acute sinusitis, unspecified: Secondary | ICD-10-CM

## 2012-12-06 MED ORDER — AMOXICILLIN-POT CLAVULANATE 875-125 MG PO TABS: 1.0000 | ORAL_TABLET | Freq: Two times a day (BID) | ORAL | Status: DC

## 2012-12-06 MED ORDER — METHYLPREDNISOLONE ACETATE 80 MG/ML IJ SUSP
120.0000 mg | Freq: Once | INTRAMUSCULAR | Status: AC
  Administered 2012-12-06: 120 mg via INTRAMUSCULAR

## 2012-12-06 NOTE — Progress Notes (Signed)
  Subjective:    Patient ID: Ryan Decker, male    DOB: [birth year], 47 y.o.   MRN: [id number]  HPI Here for a partially treated sinusitis. He was here on 11-24-12 and was given a Zpack. This has not helped, and he is much the same today. No coughing.    Review of Systems  Constitutional: Negative.   HENT: Positive for congestion, postnasal drip and sinus pressure.   Eyes: Negative.   Respiratory: Negative.        Objective:   Physical Exam  Constitutional: He appears well-developed and well-nourished.  HENT:  Right Ear: External ear normal.  Left Ear: External ear normal.  Nose: Nose normal.  Mouth/Throat: Oropharynx is clear and moist.  Eyes: Conjunctivae are normal.  Neck: No thyromegaly present.  Pulmonary/Chest: Effort normal and breath sounds normal.  Lymphadenopathy:    He has no cervical adenopathy.          Assessment & Plan:  Given a steroid shot and Augmentin.

## 2012-12-06 NOTE — Addendum Note (Signed)
Addended by: Aniceto Boss A on: 12/06/2012 03:28 PM   Modules accepted: Orders

## 2012-12-16 ENCOUNTER — Encounter: Payer: Self-pay | Admitting: Internal Medicine

## 2012-12-16 ENCOUNTER — Ambulatory Visit (INDEPENDENT_AMBULATORY_CARE_PROVIDER_SITE_OTHER): Payer: BC Managed Care – PPO | Admitting: Internal Medicine

## 2012-12-16 VITALS — BP 138/62 | HR 78 | Temp 98.0°F | Wt 210.1 lb

## 2012-12-16 DIAGNOSIS — J4541 Moderate persistent asthma with (acute) exacerbation: Secondary | ICD-10-CM

## 2012-12-16 DIAGNOSIS — J45901 Unspecified asthma with (acute) exacerbation: Secondary | ICD-10-CM

## 2012-12-16 DIAGNOSIS — E119 Type 2 diabetes mellitus without complications: Secondary | ICD-10-CM

## 2012-12-16 MED ORDER — MOMETASONE FURO-FORMOTEROL FUM 100-5 MCG/ACT IN AERO: 2.0000 | INHALATION_SPRAY | Freq: Two times a day (BID) | RESPIRATORY_TRACT | Status: DC

## 2012-12-16 MED ORDER — PREDNISONE 20 MG PO TABS: ORAL_TABLET | ORAL | Status: DC

## 2012-12-16 MED ORDER — ALBUTEROL SULFATE (2.5 MG/3ML) 0.083% IN NEBU: 2.5000 mg | INHALATION_SOLUTION | Freq: Four times a day (QID) | RESPIRATORY_TRACT | Status: DC | PRN

## 2012-12-16 NOTE — Patient Instructions (Signed)
This acts like asthmatic bronchitis sx .  5 days steroid for now and prevnetive inhaler  In the meantime. May need chest x ray if not getting better . ROV with dr fry  In the next 2-4 weeks or if not better .

## 2012-12-16 NOTE — Progress Notes (Signed)
Chief Complaint  Patient presents with  . Cough    wet- has been going on for about 3 weeks. pt states he has one more day of Augmentin and before that Dr Clent Ridges had him on ZPAK  . Shortness of Breath    HPI: Patient comes in today for [facility] Saturday clinic for  Acute  problem evaluation.  Ongoing for 3 weeks  For ur sinus infection and bronchitis  . rx first with z pack and then augmentin  On last day. and also depomedrol helped some but coming back and increasing sob albuterol used over past  2-3 days .  No fever  No ets   Cough had been loose and now tight again.   Peak flow nl 500 down to 400   Dm not in control working on this.  Hx asthma and ets as a child  Seasonal allergies and resp sx .  ROS: See pertinent positives and negatives per HPI.  Past Medical History  Diagnosis Date  . Asthma   . Diabetes mellitus   . GERD (gastroesophageal reflux disease)   . Hyperlipidemia   . Hypothyroidism     No family history on file.  History   Social History  . Marital Status: Married    Spouse Name: N/A    Number of Children: N/A  . Years of Education: N/A   Social History Main Topics  . Smoking status: Never Smoker   . Smokeless tobacco: Never Used  . Alcohol Use: No  . Drug Use: No  . Sexual Activity: None   Other Topics Concern  . None   Social History Narrative  . None    Outpatient Encounter Prescriptions as of 12/16/2012  Medication Sig  . amoxicillin-clavulanate (AUGMENTIN) 875-125 MG per tablet Take 1 tablet by mouth 2 (two) times daily.  Marland Kitchen glipiZIDE (GLUCOTROL) 10 MG tablet Take 1 tablet (10 mg total) by mouth 2 (two) times daily before a meal.  . glucose blood (ONE TOUCH ULTRA TEST) test strip Use as instructed  . levothyroxine (SYNTHROID, LEVOTHROID) 100 MCG tablet Take 1 tablet (100 mcg total) by mouth daily.  . metFORMIN (GLUCOPHAGE) 1000 MG tablet Take 1 tablet (1,000 mg total) by mouth 2 (two) times daily with a meal.  . mometasone-formoterol (DULERA)  100-5 MCG/ACT AERO Inhale 2 puffs into the lungs 2 (two) times daily.  . predniSONE (DELTASONE) 20 MG tablet Take 3 po qd for 2 days then 2 po qd for 3 days,or as directed    EXAM:  BP 138/62  Pulse 78  Temp(Src) 98 F (36.7 C) (Oral)  Wt 210 lb 1.3 oz (95.292 kg)  SpO2 98%  There is no height on file to calculate BMI. WDWN in NAD  quiet respirations; some inc resp mildly congested  . Non toxic . nl speech HEENT: Normocephalic ;atraumatic , Eyes;  PERRL, EOMs  Full, lids and conjunctiva clear,,Ears: no deformities, canals nl, TM landmarks normal, Nose: no deformity clear mucoid dc  but congested;face minimally tender Mouth : OP clear without lesion or edema . Neck: Supple without adenopathy or masses or bruits Chest:  Clear to A&P no r r but tight bs  No retractions CV:  S1-S2 no gallops or murmurs peripheral perfusion is normal Skin :nl perfusion and no acute rashes  After neb  Improved air movement and looser cough  ASSESSMENT AND PLAN:  Discussed the following assessment and plan:  Asthmatic bronchitis with exacerbation, moderate persistent - s/p 2 antibiotics  shot or  dep   DIABETES MELLITUS, TYPE II - monitor .  for  high sugars.  Monitor sugars  Fu about preventive  Rx.  -Patient advised to return or notify health care team  if symptoms worsen or persist or new concerns arise.  Patient Instructions  This acts like asthmatic bronchitis sx .  5 days steroid for now and prevnetive inhaler  In the meantime. May need chest x ray if not getting better . ROV with dr fry  In the next 2-4 weeks or if not better .   Neta Mends. Afshin Chrystal M.D.

## 2013-07-16 ENCOUNTER — Encounter: Payer: Self-pay | Admitting: Family Medicine

## 2013-07-16 ENCOUNTER — Ambulatory Visit (INDEPENDENT_AMBULATORY_CARE_PROVIDER_SITE_OTHER): Payer: BC Managed Care – PPO | Admitting: Family Medicine

## 2013-07-16 VITALS — BP 144/89 | HR 101 | Temp 98.5°F | Ht 69.0 in | Wt 210.0 lb

## 2013-07-16 DIAGNOSIS — E119 Type 2 diabetes mellitus without complications: Secondary | ICD-10-CM

## 2013-07-16 DIAGNOSIS — J45909 Unspecified asthma, uncomplicated: Secondary | ICD-10-CM

## 2013-07-16 MED ORDER — IPRATROPIUM-ALBUTEROL 0.5-2.5 (3) MG/3ML IN SOLN
3.0000 mL | Freq: Once | RESPIRATORY_TRACT | Status: AC
  Administered 2013-07-16: 3 mL via RESPIRATORY_TRACT

## 2013-07-16 MED ORDER — ALBUTEROL SULFATE HFA 108 (90 BASE) MCG/ACT IN AERS: 2.0000 | INHALATION_SPRAY | RESPIRATORY_TRACT | Status: DC | PRN

## 2013-07-16 MED ORDER — MOMETASONE FURO-FORMOTEROL FUM 100-5 MCG/ACT IN AERO: 2.0000 | INHALATION_SPRAY | Freq: Two times a day (BID) | RESPIRATORY_TRACT | Status: DC

## 2013-07-16 NOTE — Progress Notes (Signed)
   Subjective:    Patient ID: Ryan Decker, male    DOB: 1965/09/05, 48 y.o.   MRN: [id number]  HPI Here for asthma flares. He has not been using Dulera and this weekend he had some asthma flares while doing yard work. He has been using benadryl and Ventolin. No coughing or fever, just SOB and wheezing. Also he asks for a referral to see Dr. Letitia Neri for his diabetes.    Review of Systems  Constitutional: Negative.   HENT: Negative.   Eyes: Negative.   Respiratory: Positive for chest tightness, shortness of breath and wheezing. Negative for cough.        Objective:   Physical Exam  Constitutional: He appears well-developed and well-nourished.  Cardiovascular: Normal rate, regular rhythm, normal heart sounds and intact distal pulses.   Pulmonary/Chest: Effort normal and breath sounds normal.          Assessment & Plan:  Get back on Dulera and on Allegra daily.

## 2013-07-16 NOTE — Addendum Note (Signed)
Addended by: Aggie Hacker A on: 07/16/2013 12:29 PM   Modules accepted: Orders

## 2013-07-16 NOTE — Progress Notes (Signed)
Pre visit review using our clinic review tool, if applicable. No additional management support is needed unless otherwise documented below in the visit note. 

## 2013-08-07 ENCOUNTER — Encounter: Payer: Self-pay | Admitting: Family Medicine

## 2013-08-07 ENCOUNTER — Ambulatory Visit (INDEPENDENT_AMBULATORY_CARE_PROVIDER_SITE_OTHER): Payer: BC Managed Care – PPO | Admitting: Family Medicine

## 2013-08-07 VITALS — BP 128/80 | HR 97 | Temp 98.6°F | Wt 206.0 lb

## 2013-08-07 DIAGNOSIS — A088 Other specified intestinal infections: Secondary | ICD-10-CM

## 2013-08-07 DIAGNOSIS — A084 Viral intestinal infection, unspecified: Secondary | ICD-10-CM

## 2013-08-07 MED ORDER — ONDANSETRON 8 MG PO TBDP: 8.0000 mg | ORAL_TABLET | Freq: Three times a day (TID) | ORAL | Status: DC | PRN

## 2013-08-07 NOTE — Patient Instructions (Signed)

## 2013-08-07 NOTE — Progress Notes (Signed)
   Subjective:    Patient ID: Ryan Decker, male    DOB: [birth year], 48 y.o.   MRN: [id number]  Abdominal Pain Associated symptoms include diarrhea, nausea and vomiting. Pertinent negatives include no fever.   Acute visit. Patient seen with one-day history of vomiting and diarrhea. Diarrhea has been nonbloody. Fever of 101 last night. No vomiting this morning. He is keeping down sips of fluids. No diarrhea thus far today. Still some nausea. Son has type 1 diabetes and in hospital with similar symptoms. Patient had some crampy diffuse abdominal pains last night but none today.  Past Medical History  Diagnosis Date  . Asthma   . Diabetes mellitus   . GERD (gastroesophageal reflux disease)   . Hyperlipidemia   . Hypothyroidism    No past surgical history on file.  reports that he has never smoked. He has never used smokeless tobacco. He reports that he does not drink alcohol or use illicit drugs. family history is not on file. Allergies  Allergen Reactions  . Keflex [Cephalexin]     Rash       Review of Systems  Constitutional: Negative for fever and chills.  Respiratory: Negative for shortness of breath.   Cardiovascular: Negative for chest pain.  Gastrointestinal: Positive for nausea, vomiting, abdominal pain and diarrhea. Negative for blood in stool.  Neurological: Negative for dizziness.       Objective:   Physical Exam  Constitutional: He appears well-developed and well-nourished.  HENT:  Tongue slightly dry otherwise clear  Cardiovascular: Normal rate.   Pulmonary/Chest: Effort normal and breath sounds normal. No respiratory distress. He has no wheezes. He has no rales.  Abdominal: Soft. Bowel sounds are normal. He exhibits no distension. There is no tenderness. There is no rebound and no guarding.  Neurological: He is alert.          Assessment & Plan:  Probable viral gastroenteritis. Zofran 8 mg every 8 hours as needed. Followup as needed.  Diet  discussed.

## 2013-08-07 NOTE — Progress Notes (Signed)
Pre visit review using our clinic review tool, if applicable. No additional management support is needed unless otherwise documented below in the visit note. 

## 2013-09-06 ENCOUNTER — Telehealth: Payer: Self-pay | Admitting: *Deleted

## 2013-09-06 NOTE — Telephone Encounter (Signed)
Left message on machine for patient to call back and schedule an office visit/ lab appointment

## 2013-09-06 NOTE — Telephone Encounter (Signed)
Pt called back and stated that dr. Sarajane Jews referred him to a specialist for his diabetes and he has everything done there.

## 2014-05-15 ENCOUNTER — Other Ambulatory Visit: Payer: Self-pay | Admitting: Family Medicine

## 2014-05-15 NOTE — Telephone Encounter (Signed)
I left a voice message for pt to schedule a office visit, he is overdue for this and labs. I sent in a 30 day supply of diabetic medication.

## 2014-07-07 ENCOUNTER — Other Ambulatory Visit: Payer: Self-pay | Admitting: Family Medicine

## 2014-07-09 NOTE — Telephone Encounter (Signed)
Denied.  Pt has not scheduled an appt.  Aggie Hacker, RN tried reaching the pt on 05/15/14 to schedule an appt.

## 2014-08-09 ENCOUNTER — Telehealth: Payer: Self-pay | Admitting: *Deleted

## 2014-08-09 NOTE — Telephone Encounter (Signed)
Spoke with patient and he goes to Endo - Dr Product manager.  Patient will have his labs results faxed to [place].

## 2014-10-31 ENCOUNTER — Encounter: Payer: Self-pay | Admitting: Family Medicine

## 2014-10-31 ENCOUNTER — Ambulatory Visit (INDEPENDENT_AMBULATORY_CARE_PROVIDER_SITE_OTHER): Payer: BLUE CROSS/BLUE SHIELD | Admitting: Family Medicine

## 2014-10-31 VITALS — BP 129/82 | HR 83 | Temp 98.1°F | Ht 69.0 in | Wt 195.0 lb

## 2014-10-31 DIAGNOSIS — L03115 Cellulitis of right lower limb: Secondary | ICD-10-CM | POA: Diagnosis not present

## 2014-10-31 MED ORDER — DOXYCYCLINE HYCLATE 100 MG PO CAPS: 100.0000 mg | ORAL_CAPSULE | Freq: Two times a day (BID) | ORAL | Status: AC

## 2014-10-31 MED ORDER — SILDENAFIL CITRATE 100 MG PO TABS: 50.0000 mg | ORAL_TABLET | ORAL | Status: DC | PRN

## 2014-10-31 NOTE — Progress Notes (Signed)
   Subjective:    Patient ID: Ryan Decker, male    DOB: [birth year], 49 y.o.   MRN: [id number]  HPI Here for 3 days of fever to 101 degrees with chills. Then yesterday he noticed a red area on the right lower leg. This was quite red and a little swollen yesterday, but this am the swelling is down. This is mildly painful. No URI sx or any oter sx that would be associated with a fever. No recent trauma to the leg. Using Ibuprofen.    Review of Systems  Constitutional: Positive for fever and chills. Negative for diaphoresis.  HENT: Negative.   Eyes: Negative.   Respiratory: Negative.   Cardiovascular: Negative.   Gastrointestinal: Negative.   Genitourinary: Negative.   Skin: Positive for color change.       Objective:   Physical Exam  Constitutional: He appears well-developed and well-nourished. No distress.  Neck: No thyromegaly present.  Cardiovascular: Normal rate, regular rhythm, normal heart sounds and intact distal pulses.   Pulmonary/Chest: Effort normal and breath sounds normal.  Lymphadenopathy:    He has no cervical adenopathy.  Skin:  There are several areas of macular erythema on the lower right leg. This area is slightly swollen, it is warm, and it is tender. No signs of trauma.           Assessment & Plan:  The fever is most likely the result of cellulitis, so we will treat this with Doxycycline. Recheck prn

## 2014-10-31 NOTE — Progress Notes (Signed)
Pre visit review using our clinic review tool, if applicable. No additional management support is needed unless otherwise documented below in the visit note. 

## 2014-11-01 ENCOUNTER — Telehealth: Payer: Self-pay | Admitting: Family Medicine

## 2014-11-01 NOTE — Telephone Encounter (Signed)
Received PA request for sildenafil (VIAGRA) 100 MG tablet.  PA submitted over the phone, per insurance request denied, pt must first try Cialis.

## 2014-11-04 NOTE — Telephone Encounter (Signed)
Call in Cialis 20 mg prn, #10 with 11 rf

## 2014-11-05 NOTE — Telephone Encounter (Signed)
I left a voice message for pt to return my call. I need to know if pt wants to try the substitute and if so what pharmacy?

## 2014-11-06 MED ORDER — TADALAFIL 20 MG PO TABS: 20.0000 mg | ORAL_TABLET | Freq: Every day | ORAL | Status: DC | PRN

## 2014-11-06 NOTE — Telephone Encounter (Signed)
I spoke with pt and sent script e-scribe to [facility], updated medication list.

## 2014-12-06 ENCOUNTER — Telehealth: Payer: Self-pay | Admitting: Family Medicine

## 2014-12-06 DIAGNOSIS — E119 Type 2 diabetes mellitus without complications: Secondary | ICD-10-CM

## 2014-12-06 DIAGNOSIS — E039 Hypothyroidism, unspecified: Secondary | ICD-10-CM

## 2014-12-06 NOTE — Telephone Encounter (Signed)
Patient calling b/c he does not care for Dr Alteheimer. He would like a referral to [facility] to see Dr Buddy Duty. Dr Buddy Duty does not have an opening until Feb so patient needs refills on Metformin 1000mg  BID, Synthroid 15mcg, and Actos (will call back with strength) called into [facility] on [address] until he can see Dr Buddy Duty.

## 2014-12-09 NOTE — Telephone Encounter (Signed)
The referral was done. Once he tells me the dose of Actos he is taking, we will refill the meds

## 2014-12-10 MED ORDER — METFORMIN HCL 1000 MG PO TABS: 1000.0000 mg | ORAL_TABLET | Freq: Two times a day (BID) | ORAL | Status: DC

## 2014-12-10 MED ORDER — LEVOTHYROXINE SODIUM 100 MCG PO TABS: 100.0000 ug | ORAL_TABLET | Freq: Every day | ORAL | Status: DC

## 2014-12-10 NOTE — Telephone Encounter (Signed)
I spoke with pt and sent scripts e-scribe for Metformin & Synthroid. Pt will have [facility] send over the request for Actos to our office.

## 2015-01-08 ENCOUNTER — Encounter: Payer: Self-pay | Admitting: Family Medicine

## 2015-01-08 ENCOUNTER — Ambulatory Visit (INDEPENDENT_AMBULATORY_CARE_PROVIDER_SITE_OTHER): Payer: BLUE CROSS/BLUE SHIELD | Admitting: Family Medicine

## 2015-01-08 VITALS — BP 114/76 | HR 82 | Temp 98.3°F | Ht 69.0 in | Wt 196.0 lb

## 2015-01-08 DIAGNOSIS — R7989 Other specified abnormal findings of blood chemistry: Secondary | ICD-10-CM | POA: Diagnosis not present

## 2015-01-08 DIAGNOSIS — Z0000 Encounter for general adult medical examination without abnormal findings: Secondary | ICD-10-CM | POA: Diagnosis not present

## 2015-01-08 DIAGNOSIS — E119 Type 2 diabetes mellitus without complications: Secondary | ICD-10-CM

## 2015-01-08 LAB — LDL CHOLESTEROL, DIRECT: Direct LDL: 167 mg/dL

## 2015-01-08 LAB — CBC WITH DIFFERENTIAL/PLATELET
BASOS ABS: 0 10*3/uL (ref 0.0–0.1)
Basophils Relative: 0.4 % (ref 0.0–3.0)
EOS ABS: 0.2 10*3/uL (ref 0.0–0.7)
Eosinophils Relative: 2.7 % (ref 0.0–5.0)
HCT: 48.9 % (ref 39.0–52.0)
Hemoglobin: 16.7 g/dL (ref 13.0–17.0)
LYMPHS ABS: 1.7 10*3/uL (ref 0.7–4.0)
Lymphocytes Relative: 25.6 % (ref 12.0–46.0)
MCHC: 34.1 g/dL (ref 30.0–36.0)
MCV: 90 fl (ref 78.0–100.0)
Monocytes Absolute: 0.5 10*3/uL (ref 0.1–1.0)
Monocytes Relative: 7 % (ref 3.0–12.0)
NEUTROS ABS: 4.3 10*3/uL (ref 1.4–7.7)
NEUTROS PCT: 64.3 % (ref 43.0–77.0)
PLATELETS: 216 10*3/uL (ref 150.0–400.0)
RBC: 5.44 Mil/uL (ref 4.22–5.81)
RDW: 13.3 % (ref 11.5–15.5)
WBC: 6.7 10*3/uL (ref 4.0–10.5)

## 2015-01-08 LAB — HEPATIC FUNCTION PANEL
ALBUMIN: 4.3 g/dL (ref 3.5–5.2)
ALK PHOS: 53 U/L (ref 39–117)
ALT: 66 U/L — ABNORMAL HIGH (ref 0–53)
AST: 47 U/L — AB (ref 0–37)
Bilirubin, Direct: 0.1 mg/dL (ref 0.0–0.3)
TOTAL PROTEIN: 6.8 g/dL (ref 6.0–8.3)
Total Bilirubin: 0.8 mg/dL (ref 0.2–1.2)

## 2015-01-08 LAB — TSH: TSH: 6.22 u[IU]/mL — ABNORMAL HIGH (ref 0.35–4.50)

## 2015-01-08 LAB — POCT URINALYSIS DIPSTICK
BILIRUBIN UA: NEGATIVE
Blood, UA: NEGATIVE
KETONES UA: NEGATIVE
LEUKOCYTES UA: NEGATIVE
Nitrite, UA: NEGATIVE
Protein, UA: NEGATIVE
Spec Grav, UA: 1.015
Urobilinogen, UA: 0.2
pH, UA: 5

## 2015-01-08 LAB — PSA: PSA: 0.44 ng/mL (ref 0.10–4.00)

## 2015-01-08 LAB — BASIC METABOLIC PANEL
BUN: 13 mg/dL (ref 6–23)
CHLORIDE: 100 meq/L (ref 96–112)
CO2: 27 mEq/L (ref 19–32)
Calcium: 9.6 mg/dL (ref 8.4–10.5)
Creatinine, Ser: 0.88 mg/dL (ref 0.40–1.50)
GFR: 97.4 mL/min (ref >60.00)
GLUCOSE: 316 mg/dL — AB (ref 70–99)
POTASSIUM: 4.6 meq/L (ref 3.5–5.1)
Sodium: 138 mEq/L (ref 135–145)

## 2015-01-08 LAB — LIPID PANEL
CHOLESTEROL: 240 mg/dL — AB (ref 0–200)
HDL: 46.9 mg/dL (ref >39.00)
NonHDL: 193.07
TRIGLYCERIDES: 286 mg/dL — AB (ref 0.0–149.0)
Total CHOL/HDL Ratio: 5
VLDL: 57.2 mg/dL — AB (ref 0.0–40.0)

## 2015-01-08 LAB — HEMOGLOBIN A1C: Hgb A1c MFr Bld: 13.8 % — ABNORMAL HIGH (ref 4.6–6.5)

## 2015-01-08 MED ORDER — INSULIN SYRINGE-NEEDLE U-100 31G X 5/16" 0.5 ML MISC
1.0000 | Freq: Every day | Status: DC
Start: 2015-01-08 — End: 2022-01-24

## 2015-01-08 MED ORDER — LIRAGLUTIDE 18 MG/3ML ~~LOC~~ SOPN: 1.8000 mg | PEN_INJECTOR | Freq: Every day | SUBCUTANEOUS | Status: DC

## 2015-01-08 MED ORDER — LEVOTHYROXINE SODIUM 100 MCG PO TABS: 100.0000 ug | ORAL_TABLET | Freq: Every day | ORAL | Status: DC

## 2015-01-08 NOTE — Progress Notes (Signed)
   Subjective:    Patient ID: Ryan Decker, male    DOB: 06-Dec-1965, 49 y.o.   MRN: [id number]  HPI 49 yr old male for a cpx. He feels well in general.    Review of Systems  Constitutional: Negative.   HENT: Negative.   Eyes: Negative.   Respiratory: Negative.   Cardiovascular: Negative.   Gastrointestinal: Negative.   Genitourinary: Negative.   Musculoskeletal: Negative.   Skin: Negative.   Neurological: Negative.   Psychiatric/Behavioral: Negative.        Objective:   Physical Exam  Constitutional: He is oriented to person, place, and time. He appears well-developed and well-nourished. No distress.  HENT:  Head: Normocephalic and atraumatic.  Right Ear: External ear normal.  Left Ear: External ear normal.  Nose: Nose normal.  Mouth/Throat: Oropharynx is clear and moist. No oropharyngeal exudate.  Eyes: Conjunctivae and EOM are normal. Pupils are equal, round, and reactive to light. Right eye exhibits no discharge. Left eye exhibits no discharge. No scleral icterus.  Neck: Neck supple. No JVD present. No tracheal deviation present. No thyromegaly present.  Cardiovascular: Normal rate, regular rhythm, normal heart sounds and intact distal pulses.  Exam reveals no gallop and no friction rub.   No murmur heard. EKG normal   Pulmonary/Chest: Effort normal and breath sounds normal. No respiratory distress. He has no wheezes. He has no rales. He exhibits no tenderness.  Abdominal: Soft. Bowel sounds are normal. He exhibits no distension and no mass. There is no tenderness. There is no rebound and no guarding.  Genitourinary: Rectum normal, prostate normal and penis normal. Guaiac negative stool. No penile tenderness.  Musculoskeletal: Normal range of motion. He exhibits no edema or tenderness.  Lymphadenopathy:    He has no cervical adenopathy.  Neurological: He is alert and oriented to person, place, and time. He has normal reflexes. No cranial nerve deficit. He exhibits  normal muscle tone. Coordination normal.  Skin: Skin is warm and dry. No rash noted. He is not diaphoretic. No erythema. No pallor.  Psychiatric: He has a normal mood and affect. His behavior is normal. Judgment and thought content normal.          Assessment & Plan:  Well exam. Get fasting labs. Set up a colonscopy. He is waiting to hear about his first appt with Dr. Buddy Duty.

## 2015-01-08 NOTE — Progress Notes (Signed)
Pre visit review using our clinic review tool, if applicable. No additional management support is needed unless otherwise documented below in the visit note. 

## 2015-01-13 ENCOUNTER — Other Ambulatory Visit: Payer: Self-pay | Admitting: Family Medicine

## 2015-01-13 MED ORDER — INSULIN PEN NEEDLE 31G X 8 MM MISC: 1.0000 | Freq: Every day | Status: DC

## 2015-01-15 ENCOUNTER — Telehealth: Payer: Self-pay | Admitting: Family Medicine

## 2015-01-15 NOTE — Telephone Encounter (Signed)
Pt was seen on 01/08/15 and had cpx and blood work. Pt said he gave sylvia the fax # to fax results to his work place. Pt does not have fax number with him.

## 2015-01-15 NOTE — Telephone Encounter (Signed)
Ryan Decker called saying [facility] on [address] told him they didn't receive the corrected order for pen needles. I told him the order was sent on 11.28 and we received a receipt saying it went through. He said he'll call them back. He wanted to make you aware of this in case a phone call needs to be made to the pharmacy.   Pt ph# [phone] Thank you.

## 2015-01-15 NOTE — Telephone Encounter (Signed)
I spoke with pt and went over results, also faxed information, see note on lab results.

## 2015-01-15 NOTE — Telephone Encounter (Signed)
Call pharmacy to verify they did receive the order for pen needles.

## 2015-01-30 ENCOUNTER — Encounter: Payer: Self-pay | Admitting: Family Medicine

## 2015-01-30 ENCOUNTER — Ambulatory Visit (INDEPENDENT_AMBULATORY_CARE_PROVIDER_SITE_OTHER): Payer: BLUE CROSS/BLUE SHIELD | Admitting: Family Medicine

## 2015-01-30 VITALS — BP 120/79 | HR 90 | Temp 98.0°F | Ht 69.0 in | Wt 193.0 lb

## 2015-01-30 DIAGNOSIS — J209 Acute bronchitis, unspecified: Secondary | ICD-10-CM | POA: Diagnosis not present

## 2015-01-30 MED ORDER — AMOXICILLIN-POT CLAVULANATE 875-125 MG PO TABS
1.0000 | ORAL_TABLET | Freq: Two times a day (BID) | ORAL | Status: DC
Start: 2015-01-30 — End: 2016-03-05

## 2015-01-30 NOTE — Progress Notes (Signed)
Pre visit review using our clinic review tool, if applicable. No additional management support is needed unless otherwise documented below in the visit note. 

## 2015-01-30 NOTE — Progress Notes (Signed)
   Subjective:    Patient ID: Ryan Decker, male    DOB: [birth year], 49 y.o.   MRN: [id number]  HPI Here for 3 days of PND, chest tightness and coughing up yellow sputum. No fever. Using Mucinex and his inhaler.    Review of Systems  Constitutional: Negative.   HENT: Positive for congestion and postnasal drip. Negative for sinus pressure and sore throat.   Eyes: Negative.   Respiratory: Positive for cough, chest tightness and shortness of breath. Negative for wheezing.        Objective:   Physical Exam  Constitutional: He appears well-developed and well-nourished. No distress.  HENT:  Right Ear: External ear normal.  Left Ear: External ear normal.  Nose: Nose normal.  Mouth/Throat: Oropharynx is clear and moist.  Eyes: Conjunctivae are normal.  Neck: No thyromegaly present.  Cardiovascular: Normal rate, regular rhythm, normal heart sounds and intact distal pulses.   Pulmonary/Chest: Effort normal. No respiratory distress. He has no wheezes. He has no rales.  Scattered rhonchi   Lymphadenopathy:    He has no cervical adenopathy.          Assessment & Plan:  Bronchitis, treat with Augmentin. Written out of work today until 02-03-15.

## 2015-05-25 ENCOUNTER — Other Ambulatory Visit: Payer: Self-pay | Admitting: Family Medicine

## 2015-05-26 ENCOUNTER — Telehealth: Payer: Self-pay | Admitting: Family Medicine

## 2015-05-26 NOTE — Telephone Encounter (Signed)
Can we refill this? Also is this the correct dose?

## 2015-05-26 NOTE — Telephone Encounter (Signed)
Pt has an appt to endrocrinology in July and needs a refills on victoza 18 mg. [facility] [address]. Pt is out

## 2015-05-26 NOTE — Telephone Encounter (Signed)
Per Tommi Rumps okay to refill and I did send script.

## 2015-05-26 NOTE — Telephone Encounter (Signed)
Ok to refill 

## 2015-06-03 DIAGNOSIS — S93492A Sprain of other ligament of left ankle, initial encounter: Secondary | ICD-10-CM | POA: Diagnosis not present

## 2015-07-09 ENCOUNTER — Other Ambulatory Visit: Payer: Self-pay | Admitting: Family Medicine

## 2015-08-11 DIAGNOSIS — Z5181 Encounter for therapeutic drug level monitoring: Secondary | ICD-10-CM | POA: Diagnosis not present

## 2015-08-11 DIAGNOSIS — E039 Hypothyroidism, unspecified: Secondary | ICD-10-CM | POA: Diagnosis not present

## 2015-08-11 DIAGNOSIS — E1165 Type 2 diabetes mellitus with hyperglycemia: Secondary | ICD-10-CM | POA: Diagnosis not present

## 2015-08-11 DIAGNOSIS — Z833 Family history of diabetes mellitus: Secondary | ICD-10-CM | POA: Diagnosis not present

## 2015-08-12 DIAGNOSIS — Z833 Family history of diabetes mellitus: Secondary | ICD-10-CM | POA: Diagnosis not present

## 2015-08-12 DIAGNOSIS — R5383 Other fatigue: Secondary | ICD-10-CM | POA: Diagnosis not present

## 2015-08-12 DIAGNOSIS — N529 Male erectile dysfunction, unspecified: Secondary | ICD-10-CM | POA: Diagnosis not present

## 2015-08-12 DIAGNOSIS — Z5181 Encounter for therapeutic drug level monitoring: Secondary | ICD-10-CM | POA: Diagnosis not present

## 2015-08-12 DIAGNOSIS — E1165 Type 2 diabetes mellitus with hyperglycemia: Secondary | ICD-10-CM | POA: Diagnosis not present

## 2015-08-12 DIAGNOSIS — E039 Hypothyroidism, unspecified: Secondary | ICD-10-CM | POA: Diagnosis not present

## 2015-08-22 ENCOUNTER — Other Ambulatory Visit: Payer: Self-pay | Admitting: Family Medicine

## 2015-09-16 DIAGNOSIS — E063 Autoimmune thyroiditis: Secondary | ICD-10-CM | POA: Diagnosis not present

## 2015-09-16 DIAGNOSIS — E1165 Type 2 diabetes mellitus with hyperglycemia: Secondary | ICD-10-CM | POA: Diagnosis not present

## 2015-09-16 DIAGNOSIS — Z833 Family history of diabetes mellitus: Secondary | ICD-10-CM | POA: Diagnosis not present

## 2015-09-16 DIAGNOSIS — E039 Hypothyroidism, unspecified: Secondary | ICD-10-CM | POA: Diagnosis not present

## 2015-10-27 ENCOUNTER — Other Ambulatory Visit: Payer: Self-pay | Admitting: Family Medicine

## 2015-10-27 DIAGNOSIS — E1165 Type 2 diabetes mellitus with hyperglycemia: Secondary | ICD-10-CM | POA: Diagnosis not present

## 2015-10-27 DIAGNOSIS — E039 Hypothyroidism, unspecified: Secondary | ICD-10-CM | POA: Diagnosis not present

## 2015-10-27 DIAGNOSIS — E063 Autoimmune thyroiditis: Secondary | ICD-10-CM | POA: Diagnosis not present

## 2015-10-27 DIAGNOSIS — Z23 Encounter for immunization: Secondary | ICD-10-CM | POA: Diagnosis not present

## 2015-10-27 NOTE — Telephone Encounter (Signed)
Pt need new Rx for Metformin   [facility]

## 2015-10-28 ENCOUNTER — Other Ambulatory Visit: Payer: Self-pay

## 2015-10-28 MED ORDER — METFORMIN HCL 1000 MG PO TABS: 1000.0000 mg | ORAL_TABLET | Freq: Two times a day (BID) | ORAL | 3 refills | Status: DC

## 2015-10-28 NOTE — Telephone Encounter (Signed)
Rx sent for approval by Dr. Sarajane Jews. Patient lastseen 01/2015

## 2015-10-29 ENCOUNTER — Other Ambulatory Visit: Payer: Self-pay

## 2016-01-26 ENCOUNTER — Other Ambulatory Visit: Payer: Self-pay | Admitting: Family Medicine

## 2016-01-26 NOTE — Telephone Encounter (Signed)
Refill request for Ventolin inhaler [facility]

## 2016-01-28 MED ORDER — ALBUTEROL SULFATE HFA 108 (90 BASE) MCG/ACT IN AERS: 2.0000 | INHALATION_SPRAY | RESPIRATORY_TRACT | 1 refills | Status: DC | PRN

## 2016-01-28 NOTE — Telephone Encounter (Signed)
[facility] 

## 2016-03-05 ENCOUNTER — Encounter: Payer: Self-pay | Admitting: Family Medicine

## 2016-03-05 ENCOUNTER — Ambulatory Visit (INDEPENDENT_AMBULATORY_CARE_PROVIDER_SITE_OTHER): Payer: BLUE CROSS/BLUE SHIELD | Admitting: Family Medicine

## 2016-03-05 ENCOUNTER — Telehealth: Payer: Self-pay | Admitting: Family Medicine

## 2016-03-05 VITALS — BP 139/86 | HR 98 | Temp 98.3°F | Ht 69.0 in | Wt 208.0 lb

## 2016-03-05 DIAGNOSIS — J209 Acute bronchitis, unspecified: Secondary | ICD-10-CM

## 2016-03-05 MED ORDER — AZITHROMYCIN 250 MG PO TABS: ORAL_TABLET | ORAL | 0 refills | Status: DC

## 2016-03-05 MED ORDER — METHYLPREDNISOLONE 4 MG PO TBPK: ORAL_TABLET | ORAL | 0 refills | Status: DC

## 2016-03-05 NOTE — Progress Notes (Signed)
Pre visit review using our clinic review tool, if applicable. No additional management support is needed unless otherwise documented below in the visit note. 

## 2016-03-05 NOTE — Telephone Encounter (Signed)
Patient Name: Ryan Decker DOB: [birth year] Initial Comment Caller says, sick for 4 days, fever, cough, short of breath, wants an appt today Nurse Assessment Nurse: Ronnald Ramp, RN, Miranda Date/Time (Eastern Time): 03/05/2016 10:13:45 AM Confirm and document reason for call. If symptomatic, describe symptoms. ---Caller states he has had a cough and fever for 3 days. Today temp is normal. Albuterol Q4hrs (last dose 2 hrs ago) Does the patient have any new or worsening symptoms? ---Yes Will a triage be completed? ---Yes Related visit to physician within the last 2 weeks? ---No Does the PT have any chronic conditions? (i.e. diabetes, asthma, etc.) ---Yes List chronic conditions. ---Asthma, Diabetes Is this a behavioral health or substance abuse call? ---No Guidelines Guideline Title Affirmed Question Affirmed Notes Asthma Attack [1] MILD asthma attack (e.g., no SOB at rest, mild SOB with walking, speaks normally in sentences, mild wheezing) AND [2] persists > 24 hours on appropriate treatment Final Disposition User See Physician within 24 Hours Ronnald Ramp, RN, Miranda Comments Appt scheduled with Dr. Sarajane Jews at 1045 Referrals REFERRED TO PCP OFFICE Disagree/Comply: Comply

## 2016-03-05 NOTE — Telephone Encounter (Signed)
Noted  

## 2016-03-05 NOTE — Progress Notes (Signed)
   Subjective:    Patient ID: Ryan Decker, male    DOB: [birth year], 51 y.o.   MRN: [id number]  HPI Here for 4 days of fever to 100.7 degrees, PND, chest tightness and coughing up yellow sputum. Using TheraFlu. Using his albuterol inhaler several times a day.    Review of Systems  Constitutional: Positive for chills, diaphoresis and fever.  HENT: Positive for congestion and postnasal drip. Negative for sinus pain, sinus pressure and sore throat.   Eyes: Negative.   Respiratory: Positive for cough, chest tightness and wheezing.   Cardiovascular: Negative.        Objective:   Physical Exam  Constitutional: He appears well-developed and well-nourished.  HENT:  Right Ear: External ear normal.  Left Ear: External ear normal.  Nose: Nose normal.  Mouth/Throat: Oropharynx is clear and moist.  Eyes: Conjunctivae are normal.  Neck: No thyromegaly present.  Pulmonary/Chest: Effort normal. No respiratory distress. He has no rales.  Scattered rhonchi and wheezes   Lymphadenopathy:    He has no cervical adenopathy.          Assessment & Plan:  Bronchitis, treat with a Zpack and a Medrol dose pack. Written out of work for today.  Alysia Penna, MD

## 2016-03-22 ENCOUNTER — Telehealth: Payer: Self-pay | Admitting: Family Medicine

## 2016-03-22 MED ORDER — OSELTAMIVIR PHOSPHATE 75 MG PO CAPS: 75.0000 mg | ORAL_CAPSULE | Freq: Two times a day (BID) | ORAL | 0 refills | Status: DC

## 2016-03-22 NOTE — Telephone Encounter (Signed)
The patients son was jusr diagnosed with flu and was wanting to see if Dr. Sarajane Jews can call in some TheraFlu to the pharmacy to get ahead of the game.  [place], [place] - [id number] [phone] (Phone) [phone] (Fax)

## 2016-03-22 NOTE — Telephone Encounter (Signed)
I sent script e-scribe to [facility] and left a voice message for pt with this information.

## 2016-03-22 NOTE — Telephone Encounter (Signed)
Call in Tamiflu 75 mg to take bid for 5 days

## 2016-04-29 DIAGNOSIS — E063 Autoimmune thyroiditis: Secondary | ICD-10-CM | POA: Diagnosis not present

## 2016-04-29 DIAGNOSIS — R748 Abnormal levels of other serum enzymes: Secondary | ICD-10-CM | POA: Diagnosis not present

## 2016-04-29 DIAGNOSIS — Z833 Family history of diabetes mellitus: Secondary | ICD-10-CM | POA: Diagnosis not present

## 2016-04-29 DIAGNOSIS — E039 Hypothyroidism, unspecified: Secondary | ICD-10-CM | POA: Diagnosis not present

## 2016-04-29 DIAGNOSIS — E1165 Type 2 diabetes mellitus with hyperglycemia: Secondary | ICD-10-CM | POA: Diagnosis not present

## 2016-05-11 ENCOUNTER — Encounter: Payer: Self-pay | Admitting: Family Medicine

## 2016-05-11 ENCOUNTER — Ambulatory Visit (INDEPENDENT_AMBULATORY_CARE_PROVIDER_SITE_OTHER): Payer: BLUE CROSS/BLUE SHIELD | Admitting: Family Medicine

## 2016-05-11 VITALS — BP 126/88 | HR 99 | Temp 97.9°F | Ht 69.0 in | Wt 209.0 lb

## 2016-05-11 DIAGNOSIS — J029 Acute pharyngitis, unspecified: Secondary | ICD-10-CM

## 2016-05-11 MED ORDER — AMOXICILLIN-POT CLAVULANATE 875-125 MG PO TABS: 1.0000 | ORAL_TABLET | Freq: Two times a day (BID) | ORAL | 0 refills | Status: DC

## 2016-05-11 NOTE — Progress Notes (Signed)
Pre visit review using our clinic review tool, if applicable. No additional management support is needed unless otherwise documented below in the visit note. 

## 2016-05-11 NOTE — Patient Instructions (Signed)
WE NOW OFFER   [facility] FAST TRACK!!!  SAME DAY Appointments for ACUTE CARE  Such as: Sprains, Injuries, cuts, abrasions, rashes, muscle pain, joint pain, back pain Colds, flu, sore throats, headache, allergies, cough, fever  Ear pain, sinus and eye infections Abdominal pain, nausea, vomiting, diarrhea, upset stomach Animal/insect bites  3 Easy Ways to Schedule: Walk-In Scheduling Call in scheduling Mychart Sign-up: [email or web address]         

## 2016-05-11 NOTE — Progress Notes (Signed)
   Subjective:    Patient ID: Ryan Decker, male    DOB: [birth year], 51 y.o.   MRN: [id number]  HPI Here for 5 days of ST and a dry cough. No fever.    Review of Systems  Constitutional: Negative.   HENT: Positive for congestion, postnasal drip and sore throat. Negative for sinus pain and sinus pressure.   Eyes: Negative.   Respiratory: Positive for cough.        Objective:   Physical Exam  Constitutional: He appears well-developed and well-nourished.  HENT:  Right Ear: External ear normal.  Left Ear: External ear normal.  Nose: Nose normal.  Mouth/Throat: Oropharynx is clear and moist.  Eyes: Conjunctivae are normal.  Neck: No thyromegaly present.  Pulmonary/Chest: Effort normal and breath sounds normal.  Lymphadenopathy:    He has no cervical adenopathy.          Assessment & Plan:  Pharyngitis, treat with Augmentin.  Alysia Penna, MD

## 2016-06-16 DIAGNOSIS — E039 Hypothyroidism, unspecified: Secondary | ICD-10-CM | POA: Diagnosis not present

## 2016-06-25 DIAGNOSIS — E113293 Type 2 diabetes mellitus with mild nonproliferative diabetic retinopathy without macular edema, bilateral: Secondary | ICD-10-CM | POA: Diagnosis not present

## 2016-06-25 DIAGNOSIS — H5213 Myopia, bilateral: Secondary | ICD-10-CM | POA: Diagnosis not present

## 2016-10-19 ENCOUNTER — Encounter: Payer: Self-pay | Admitting: Family Medicine

## 2016-10-19 ENCOUNTER — Ambulatory Visit (INDEPENDENT_AMBULATORY_CARE_PROVIDER_SITE_OTHER): Payer: BLUE CROSS/BLUE SHIELD | Admitting: Family Medicine

## 2016-10-19 VITALS — BP 127/87 | HR 88 | Temp 98.1°F | Ht 69.0 in | Wt 210.0 lb

## 2016-10-19 DIAGNOSIS — L03114 Cellulitis of left upper limb: Secondary | ICD-10-CM | POA: Diagnosis not present

## 2016-10-19 MED ORDER — DOXYCYCLINE HYCLATE 100 MG PO TABS
100.0000 mg | ORAL_TABLET | Freq: Two times a day (BID) | ORAL | 0 refills | Status: DC
Start: 2016-10-19 — End: 2017-03-28

## 2016-10-19 NOTE — Progress Notes (Signed)
   Subjective:    Patient ID: Ryan Decker, male    DOB: 03/31/1965, 51 y.o.   MRN: [id number]  HPI Here for several days of mild tenderness and warmth on the left arm. No recent trauma. He has a hx of cellulitis on the arms or legs and he thinks he is developing this again. No fever.    Review of Systems  Constitutional: Negative.   Respiratory: Negative.   Cardiovascular: Negative.   Skin: Positive for color change.  Neurological: Negative.        Objective:   Physical Exam  Constitutional: He appears well-developed and well-nourished.  Cardiovascular: Normal rate, regular rhythm, normal heart sounds and intact distal pulses.   Pulmonary/Chest: Effort normal and breath sounds normal.  Skin:  The skin over the lateral left upper arm, elbow, and lower arm is pink and slightly warm. Not tender.           Assessment & Plan:  Early cellulitis, treat with Doxycycline. Alysia Penna, MD

## 2016-10-19 NOTE — Patient Instructions (Signed)
WE NOW OFFER   [facility] FAST TRACK!!!  SAME DAY Appointments for ACUTE CARE  Such as: Sprains, Injuries, cuts, abrasions, rashes, muscle pain, joint pain, back pain Colds, flu, sore throats, headache, allergies, cough, fever  Ear pain, sinus and eye infections Abdominal pain, nausea, vomiting, diarrhea, upset stomach Animal/insect bites  3 Easy Ways to Schedule: Walk-In Scheduling Call in scheduling Mychart Sign-up: [email or web address]         

## 2016-11-04 ENCOUNTER — Encounter: Payer: Self-pay | Admitting: Family Medicine

## 2017-01-25 ENCOUNTER — Encounter: Payer: BLUE CROSS/BLUE SHIELD | Admitting: Family Medicine

## 2017-01-28 ENCOUNTER — Ambulatory Visit (INDEPENDENT_AMBULATORY_CARE_PROVIDER_SITE_OTHER): Payer: BLUE CROSS/BLUE SHIELD | Admitting: Family Medicine

## 2017-01-28 ENCOUNTER — Encounter: Payer: Self-pay | Admitting: Family Medicine

## 2017-01-28 VITALS — BP 102/70 | HR 85 | Temp 97.8°F | Ht 65.75 in | Wt 206.6 lb

## 2017-01-28 DIAGNOSIS — Z23 Encounter for immunization: Secondary | ICD-10-CM

## 2017-01-28 DIAGNOSIS — L309 Dermatitis, unspecified: Secondary | ICD-10-CM | POA: Diagnosis not present

## 2017-01-28 DIAGNOSIS — E119 Type 2 diabetes mellitus without complications: Secondary | ICD-10-CM | POA: Diagnosis not present

## 2017-01-28 DIAGNOSIS — Z0000 Encounter for general adult medical examination without abnormal findings: Secondary | ICD-10-CM | POA: Diagnosis not present

## 2017-01-28 LAB — CBC WITH DIFFERENTIAL/PLATELET
Basophils Absolute: 0 10*3/uL (ref 0.0–0.1)
Basophils Relative: 0.8 % (ref 0.0–3.0)
EOS ABS: 0.2 10*3/uL (ref 0.0–0.7)
EOS PCT: 2.5 % (ref 0.0–5.0)
HEMATOCRIT: 47 % (ref 39.0–52.0)
Hemoglobin: 15.5 g/dL (ref 13.0–17.0)
LYMPHS PCT: 30.2 % (ref 12.0–46.0)
Lymphs Abs: 1.9 10*3/uL (ref 0.7–4.0)
MCHC: 33 g/dL (ref 30.0–36.0)
MCV: 92.9 fl (ref 78.0–100.0)
Monocytes Absolute: 0.5 10*3/uL (ref 0.1–1.0)
Monocytes Relative: 7.9 % (ref 3.0–12.0)
NEUTROS ABS: 3.6 10*3/uL (ref 1.4–7.7)
Neutrophils Relative %: 58.6 % (ref 43.0–77.0)
PLATELETS: 256 10*3/uL (ref 150.0–400.0)
RBC: 5.06 Mil/uL (ref 4.22–5.81)
RDW: 13.2 % (ref 11.5–15.5)
WBC: 6.2 10*3/uL (ref 4.0–10.5)

## 2017-01-28 LAB — HEPATIC FUNCTION PANEL
ALBUMIN: 4.1 g/dL (ref 3.5–5.2)
ALK PHOS: 58 U/L (ref 39–117)
ALT: 75 U/L — ABNORMAL HIGH (ref 0–53)
AST: 46 U/L — AB (ref 0–37)
Bilirubin, Direct: 0.1 mg/dL (ref 0.0–0.3)
TOTAL PROTEIN: 6.7 g/dL (ref 6.0–8.3)
Total Bilirubin: 0.8 mg/dL (ref 0.2–1.2)

## 2017-01-28 LAB — TSH: TSH: 8.57 u[IU]/mL — ABNORMAL HIGH (ref 0.35–4.50)

## 2017-01-28 LAB — POC URINALSYSI DIPSTICK (AUTOMATED)
BILIRUBIN UA: NEGATIVE
Ketones, UA: NEGATIVE
Leukocytes, UA: NEGATIVE
NITRITE UA: NEGATIVE
Protein, UA: NEGATIVE
RBC UA: NEGATIVE
Spec Grav, UA: >=1.03 — AB (ref 1.010–1.025)
Urobilinogen, UA: 0.2 E.U./dL
pH, UA: 5.5 (ref 5.0–8.0)

## 2017-01-28 LAB — PSA: PSA: 0.35 ng/mL (ref 0.10–4.00)

## 2017-01-28 LAB — LIPID PANEL
CHOLESTEROL: 185 mg/dL (ref 0–200)
HDL: 52 mg/dL (ref >39.00)
LDL Cholesterol: 109 mg/dL — ABNORMAL HIGH (ref 0–99)
NonHDL: 132.68
TRIGLYCERIDES: 120 mg/dL (ref 0.0–149.0)
Total CHOL/HDL Ratio: 4
VLDL: 24 mg/dL (ref 0.0–40.0)

## 2017-01-28 LAB — MICROALBUMIN / CREATININE URINE RATIO
Creatinine,U: 152.9 mg/dL
MICROALB UR: 1 mg/dL (ref 0.0–1.9)
Microalb Creat Ratio: 0.7 mg/g (ref 0.0–30.0)

## 2017-01-28 LAB — HEMOGLOBIN A1C: Hgb A1c MFr Bld: 11.7 % — ABNORMAL HIGH (ref 4.6–6.5)

## 2017-01-28 LAB — BASIC METABOLIC PANEL
BUN: 12 mg/dL (ref 6–23)
CHLORIDE: 101 meq/L (ref 96–112)
CO2: 32 mEq/L (ref 19–32)
Calcium: 9.2 mg/dL (ref 8.4–10.5)
Creatinine, Ser: 0.78 mg/dL (ref 0.40–1.50)
GFR: 111.03 mL/min (ref >60.00)
GLUCOSE: 182 mg/dL — AB (ref 70–99)
POTASSIUM: 3.9 meq/L (ref 3.5–5.1)
Sodium: 140 mEq/L (ref 135–145)

## 2017-01-28 MED ORDER — ALBUTEROL SULFATE HFA 108 (90 BASE) MCG/ACT IN AERS: 2.0000 | INHALATION_SPRAY | RESPIRATORY_TRACT | 5 refills | Status: AC | PRN

## 2017-01-28 MED ORDER — LEVOTHYROXINE SODIUM 125 MCG PO TABS: 125.0000 ug | ORAL_TABLET | Freq: Every day | ORAL | 0 refills | Status: DC

## 2017-01-28 MED ORDER — TRIAMCINOLONE ACETONIDE 0.1 % EX CREA: 1.0000 "application " | TOPICAL_CREAM | Freq: Three times a day (TID) | CUTANEOUS | 5 refills | Status: DC

## 2017-01-28 NOTE — Progress Notes (Signed)
   Subjective:    Patient ID: Ryan Decker, male    DOB: June 14, 1965, 51 y.o.   MRN: [id number]  HPI Here for a well exam. He feels well but does ask about itchy skin on his hands. He has been using OTC lotions with little effect. He sees Dr. Buddy Duty for his diabetes. He gets a yearly eye exam and so far there has been no sign of diabetic damage.    Review of Systems  Constitutional: Negative.   HENT: Negative.   Eyes: Negative.   Respiratory: Negative.   Cardiovascular: Negative.   Gastrointestinal: Negative.   Genitourinary: Negative.   Musculoskeletal: Negative.   Skin: Positive for rash. Negative for color change, pallor and wound.  Neurological: Negative.   Psychiatric/Behavioral: Negative.        Objective:   Physical Exam  Constitutional: He is oriented to person, place, and time. He appears well-developed and well-nourished. No distress.  HENT:  Head: Normocephalic and atraumatic.  Right Ear: External ear normal.  Left Ear: External ear normal.  Nose: Nose normal.  Mouth/Throat: Oropharynx is clear and moist. No oropharyngeal exudate.  Eyes: Conjunctivae and EOM are normal. Pupils are equal, round, and reactive to light. Right eye exhibits no discharge. Left eye exhibits no discharge. No scleral icterus.  Neck: Neck supple. No JVD present. No tracheal deviation present. No thyromegaly present.  Cardiovascular: Normal rate, regular rhythm, normal heart sounds and intact distal pulses. Exam reveals no gallop and no friction rub.  No murmur heard. Pulmonary/Chest: Effort normal and breath sounds normal. No respiratory distress. He has no wheezes. He has no rales. He exhibits no tenderness.  Abdominal: Soft. Bowel sounds are normal. He exhibits no distension and no mass. There is no tenderness. There is no rebound and no guarding.  Genitourinary: Rectum normal, prostate normal and penis normal. Rectal exam shows guaiac negative stool. No penile tenderness.  Musculoskeletal:  Normal range of motion. He exhibits no edema or tenderness.  Lymphadenopathy:    He has no cervical adenopathy.  Neurological: He is alert and oriented to person, place, and time. He has normal reflexes. No cranial nerve deficit. He exhibits normal muscle tone. Coordination normal.  Skin: Skin is warm and dry. He is not diaphoretic. No erythema. No pallor.  Both hands have areas of thickened scaly skin   Psychiatric: He has a normal mood and affect. His behavior is normal. Judgment and thought content normal.          Assessment & Plan:  Well exam. We discussed diet and exercise. Get fasting labs. Set up a colonoscopy. He has eczema on the hands and he will try Triamcinolone cream for this.  Alysia Penna, MD

## 2017-02-09 ENCOUNTER — Telehealth: Payer: Self-pay | Admitting: Family Medicine

## 2017-02-09 NOTE — Telephone Encounter (Signed)
CRM for notification. See Telephone encounter for:   02/09/17.   Relation to pt: self  Call back [phone]  Reason for call:  Patient checking on the status if most recent lab results were fax to employer attention [facility]  Vashti Hey RN please fax [phone]

## 2017-02-10 NOTE — Telephone Encounter (Signed)
Faxed lab results. Called pt and left a VM that this was done.

## 2017-02-18 DIAGNOSIS — E1165 Type 2 diabetes mellitus with hyperglycemia: Secondary | ICD-10-CM | POA: Diagnosis not present

## 2017-02-18 DIAGNOSIS — Z833 Family history of diabetes mellitus: Secondary | ICD-10-CM | POA: Diagnosis not present

## 2017-02-18 DIAGNOSIS — E039 Hypothyroidism, unspecified: Secondary | ICD-10-CM | POA: Diagnosis not present

## 2017-02-18 DIAGNOSIS — E063 Autoimmune thyroiditis: Secondary | ICD-10-CM | POA: Diagnosis not present

## 2017-02-24 ENCOUNTER — Other Ambulatory Visit: Payer: Self-pay

## 2017-02-24 MED ORDER — LEVOTHYROXINE SODIUM 125 MCG PO TABS: 125.0000 ug | ORAL_TABLET | Freq: Every day | ORAL | 2 refills | Status: DC

## 2017-03-28 ENCOUNTER — Encounter: Payer: Self-pay | Admitting: Family Medicine

## 2017-03-28 ENCOUNTER — Ambulatory Visit: Payer: BLUE CROSS/BLUE SHIELD | Admitting: Family Medicine

## 2017-03-28 VITALS — BP 118/72 | HR 98 | Temp 98.3°F | Wt 211.0 lb

## 2017-03-28 DIAGNOSIS — R6889 Other general symptoms and signs: Secondary | ICD-10-CM

## 2017-03-28 DIAGNOSIS — A084 Viral intestinal infection, unspecified: Secondary | ICD-10-CM

## 2017-03-28 LAB — POC INFLUENZA A&B (BINAX/QUICKVUE)
Influenza A, POC: NEGATIVE
Influenza B, POC: NEGATIVE

## 2017-03-28 MED ORDER — ONDANSETRON HCL 8 MG PO TABS: 8.0000 mg | ORAL_TABLET | Freq: Three times a day (TID) | ORAL | 0 refills | Status: DC | PRN

## 2017-03-28 NOTE — Progress Notes (Signed)
   Subjective:    Patient ID: Ryan Decker, male    DOB: 1965/12/31, 52 y.o.   MRN: [id number]  HPI Here for several issues. About one week ago he developed some PND and a dry cough. No fever. He saw a provider at his work clinic and they gave him a Zpack. He has been taking this and has one day's worth left to take. The chest congestion has improved. However 2 days ago he also developed constant nausea without vomiting and diarrhea. No abdominal cramps but his stomach is churning. He can sip fluids but he has eaten very little food. Still no fever.    Review of Systems  Constitutional: Negative.   HENT: Negative.   Eyes: Negative.   Respiratory: Negative.   Cardiovascular: Negative.   Gastrointestinal: Positive for diarrhea and nausea. Negative for abdominal distention, abdominal pain, constipation and vomiting.       Objective:   Physical Exam  Constitutional: He appears well-developed and well-nourished.  HENT:  Right Ear: External ear normal.  Left Ear: External ear normal.  Nose: Nose normal.  Mouth/Throat: Oropharynx is clear and moist.  Eyes: Conjunctivae are normal.  Neck: No thyromegaly present.  Pulmonary/Chest: Effort normal and breath sounds normal. No respiratory distress. He has no wheezes. He has no rales.  Abdominal: Soft. Bowel sounds are normal. He exhibits no distension and no mass. There is no tenderness. There is no rebound and no guarding.  Lymphadenopathy:    He has no cervical adenopathy.          Assessment & Plan:  He is recovering from what sounds like was a viral URI. Now he also has a GI virus, likely Norovirus. He will try Zofran for nausea and Imodium for diarrhea. Drink fluids. Written out of work today and tomorrow.  Alysia Penna, MD

## 2017-03-31 ENCOUNTER — Encounter: Payer: Self-pay | Admitting: Family Medicine

## 2017-04-01 ENCOUNTER — Ambulatory Visit: Payer: BLUE CROSS/BLUE SHIELD | Admitting: Family Medicine

## 2017-04-01 ENCOUNTER — Encounter: Payer: Self-pay | Admitting: Family Medicine

## 2017-04-01 VITALS — BP 118/72 | HR 106 | Temp 97.9°F | Wt 214.6 lb

## 2017-04-01 DIAGNOSIS — J018 Other acute sinusitis: Secondary | ICD-10-CM

## 2017-04-01 MED ORDER — AMOXICILLIN-POT CLAVULANATE 875-125 MG PO TABS: 1.0000 | ORAL_TABLET | Freq: Two times a day (BID) | ORAL | 0 refills | Status: DC

## 2017-04-01 NOTE — Progress Notes (Signed)
   Subjective:    Patient ID: Ryan Decker, male    DOB: [birth year], 52 y.o.   MRN: [id number]  HPI Here for 4 days of stuffy head, PND, and ST. No cough or fever.    Review of Systems  Constitutional: Negative.   HENT: Positive for congestion, postnasal drip, sinus pressure, sinus pain and sore throat.   Eyes: Negative.   Respiratory: Negative.        Objective:   Physical Exam  Constitutional: He appears well-developed and well-nourished.  HENT:  Right Ear: External ear normal.  Left Ear: External ear normal.  Nose: Nose normal.  Mouth/Throat: Oropharynx is clear and moist.  Eyes: Conjunctivae are normal.  Neck: No thyromegaly present.  Pulmonary/Chest: Effort normal and breath sounds normal. No respiratory distress. He has no wheezes. He has no rales.  Lymphadenopathy:    He has no cervical adenopathy.          Assessment & Plan:  Sinusitis, treat with Augmentin. Alysia Penna, MD

## 2017-06-13 ENCOUNTER — Other Ambulatory Visit: Payer: Self-pay | Admitting: Internal Medicine

## 2017-06-13 DIAGNOSIS — R748 Abnormal levels of other serum enzymes: Secondary | ICD-10-CM

## 2017-06-13 DIAGNOSIS — E063 Autoimmune thyroiditis: Secondary | ICD-10-CM | POA: Diagnosis not present

## 2017-06-13 DIAGNOSIS — Z833 Family history of diabetes mellitus: Secondary | ICD-10-CM | POA: Diagnosis not present

## 2017-06-13 DIAGNOSIS — E039 Hypothyroidism, unspecified: Secondary | ICD-10-CM | POA: Diagnosis not present

## 2017-06-13 DIAGNOSIS — E1165 Type 2 diabetes mellitus with hyperglycemia: Secondary | ICD-10-CM | POA: Diagnosis not present

## 2017-06-29 ENCOUNTER — Ambulatory Visit
Admission: RE | Admit: 2017-06-29 | Discharge: 2017-06-29 | Disposition: A | Discharge status: Home / Self Care | Payer: BLUE CROSS/BLUE SHIELD | Source: Ambulatory Visit | Attending: Internal Medicine | Admitting: Internal Medicine

## 2017-06-29 DIAGNOSIS — K8020 Calculus of gallbladder without cholecystitis without obstruction: Secondary | ICD-10-CM | POA: Diagnosis not present

## 2017-06-29 DIAGNOSIS — R748 Abnormal levels of other serum enzymes: Secondary | ICD-10-CM

## 2017-09-14 DIAGNOSIS — E063 Autoimmune thyroiditis: Secondary | ICD-10-CM | POA: Diagnosis not present

## 2017-09-14 DIAGNOSIS — E1165 Type 2 diabetes mellitus with hyperglycemia: Secondary | ICD-10-CM | POA: Diagnosis not present

## 2017-09-14 DIAGNOSIS — Z833 Family history of diabetes mellitus: Secondary | ICD-10-CM | POA: Diagnosis not present

## 2017-09-14 DIAGNOSIS — E039 Hypothyroidism, unspecified: Secondary | ICD-10-CM | POA: Diagnosis not present

## 2017-11-23 ENCOUNTER — Other Ambulatory Visit: Payer: Self-pay | Admitting: Family Medicine

## 2017-12-15 DIAGNOSIS — E039 Hypothyroidism, unspecified: Secondary | ICD-10-CM | POA: Diagnosis not present

## 2017-12-15 DIAGNOSIS — E063 Autoimmune thyroiditis: Secondary | ICD-10-CM | POA: Diagnosis not present

## 2017-12-15 DIAGNOSIS — R899 Unspecified abnormal finding in specimens from other organs, systems and tissues: Secondary | ICD-10-CM | POA: Diagnosis not present

## 2017-12-15 DIAGNOSIS — E1165 Type 2 diabetes mellitus with hyperglycemia: Secondary | ICD-10-CM | POA: Diagnosis not present

## 2017-12-15 DIAGNOSIS — Z833 Family history of diabetes mellitus: Secondary | ICD-10-CM | POA: Diagnosis not present

## 2018-05-12 ENCOUNTER — Other Ambulatory Visit: Payer: Self-pay | Admitting: Gastroenterology

## 2018-05-29 ENCOUNTER — Encounter: Payer: Self-pay | Admitting: *Deleted

## 2018-10-08 IMAGING — US US ABDOMEN LIMITED
1 series · 13 of 25 positions shown · non-contrast
Comparison: None.

CLINICAL DATA: Elevated liver enzymes

EXAM:
ULTRASOUND ABDOMEN LIMITED RIGHT UPPER QUADRANT

[Series 1: us abdomen limited · 0.22mm/px · 46 acquisitions, 13 frames shown]
[im 1/46]
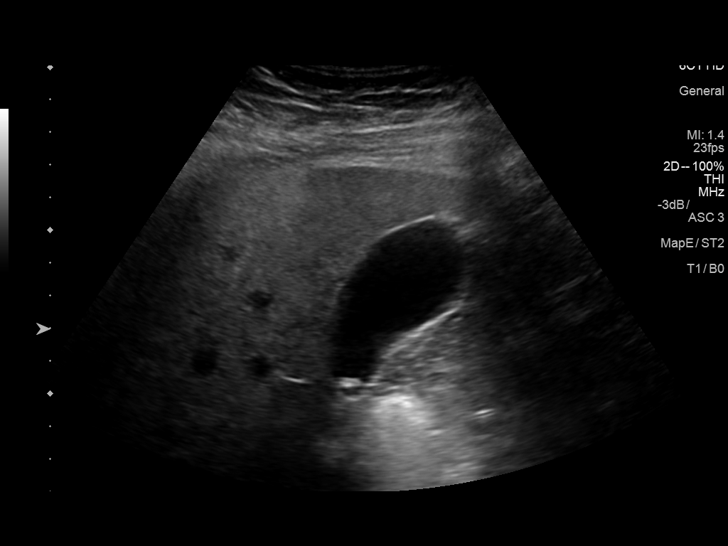
[im 4/46]
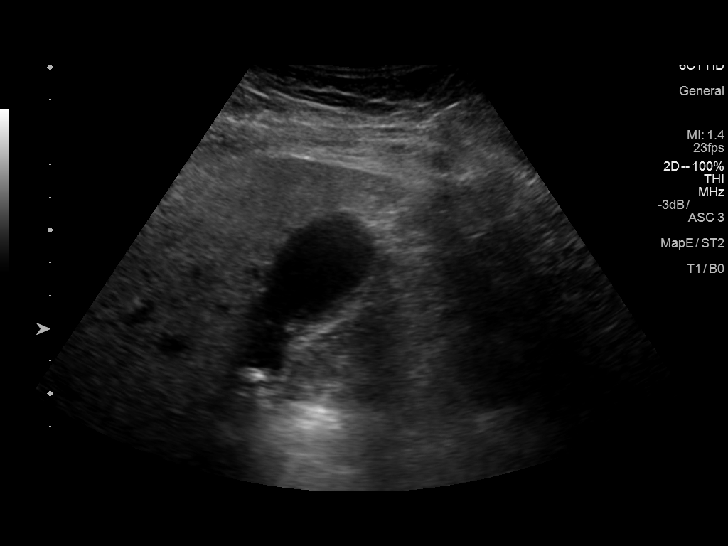
[im 8/46]
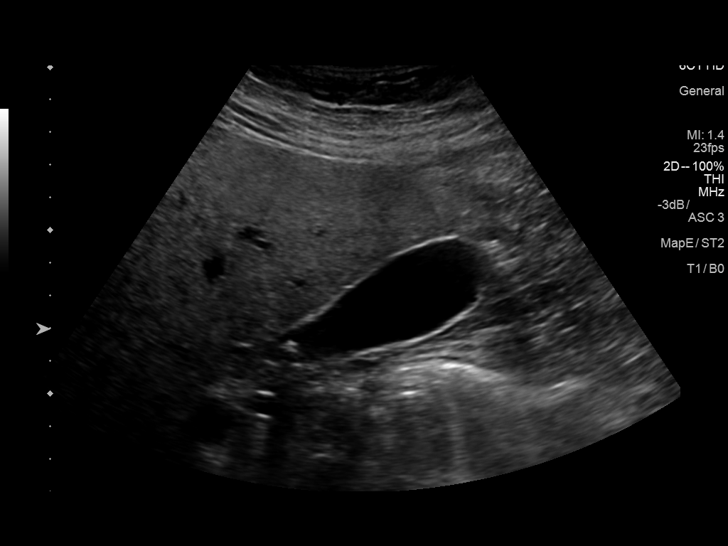
[im 12/46]
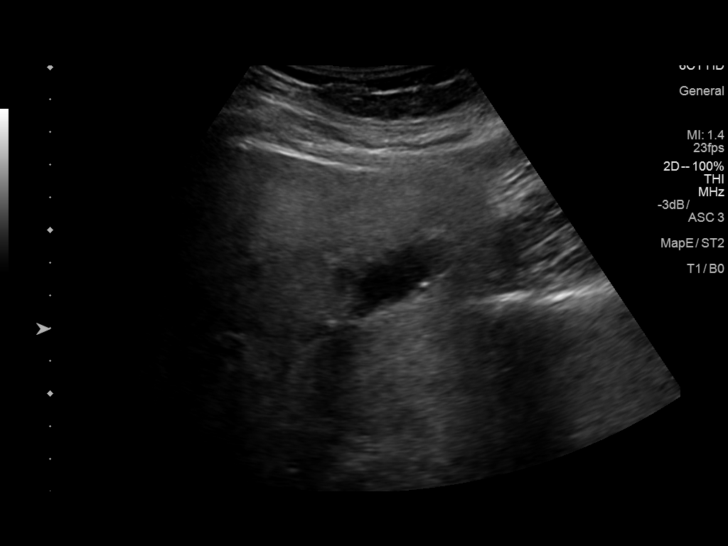
[im 16/46]
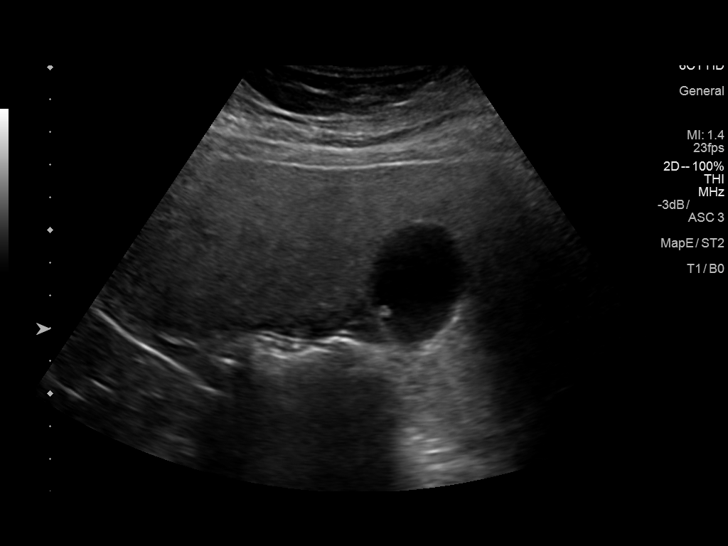
[im 19/46]
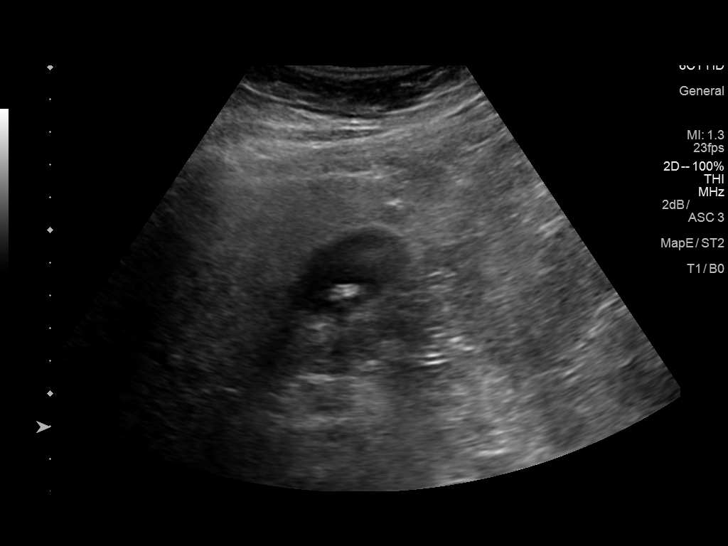
[im 23/46]
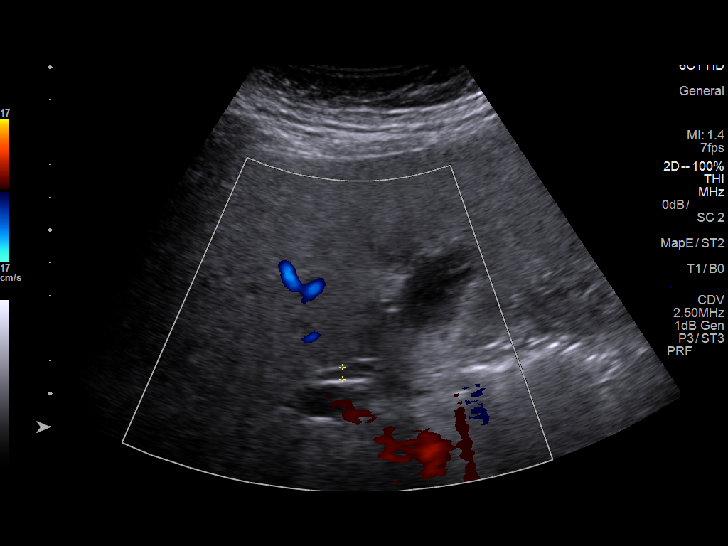
[im 27/46]
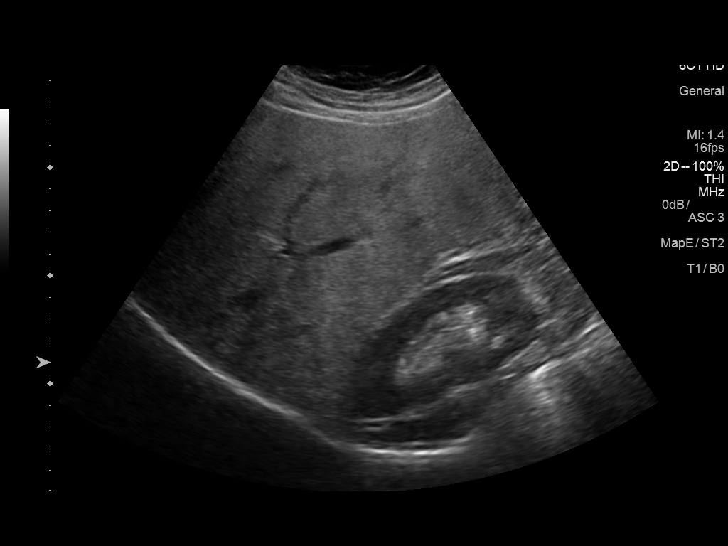
[im 31/46]
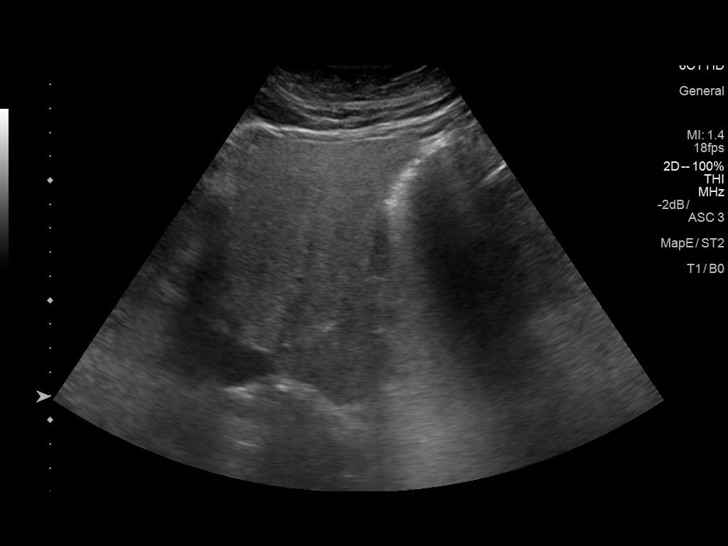
[im 34/46]
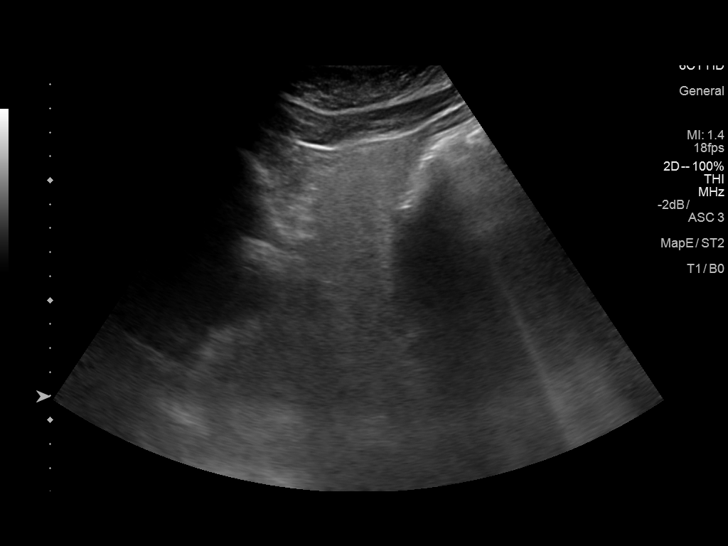
[im 38/46]
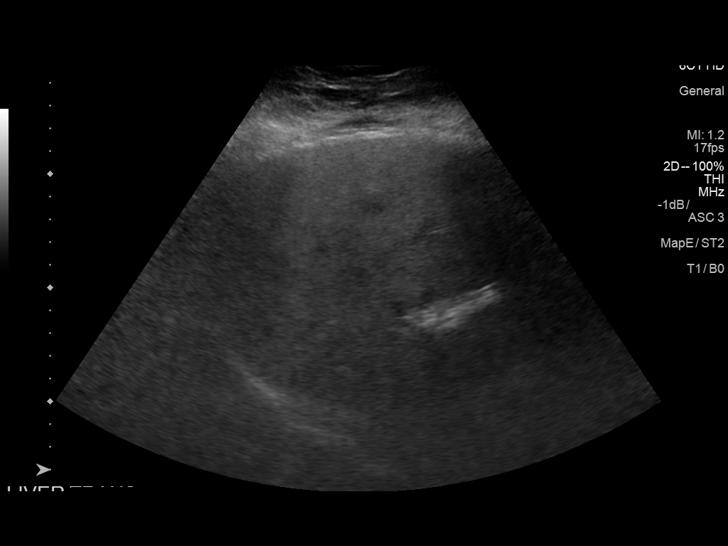
[im 42/46]
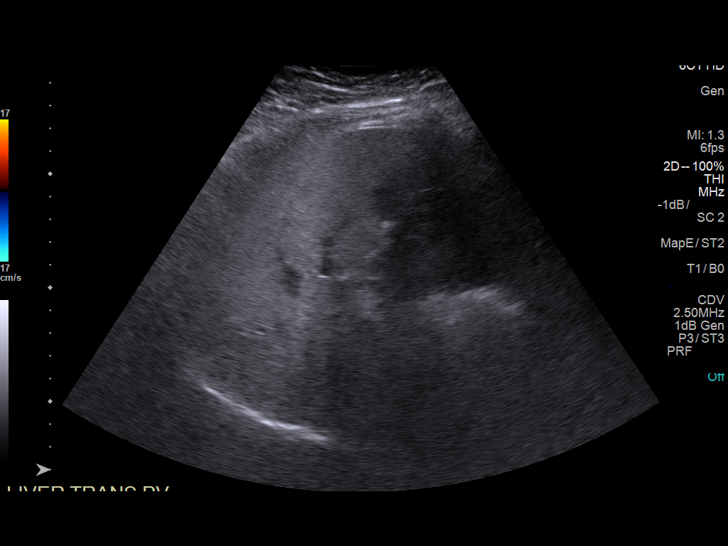
[im 46/46]
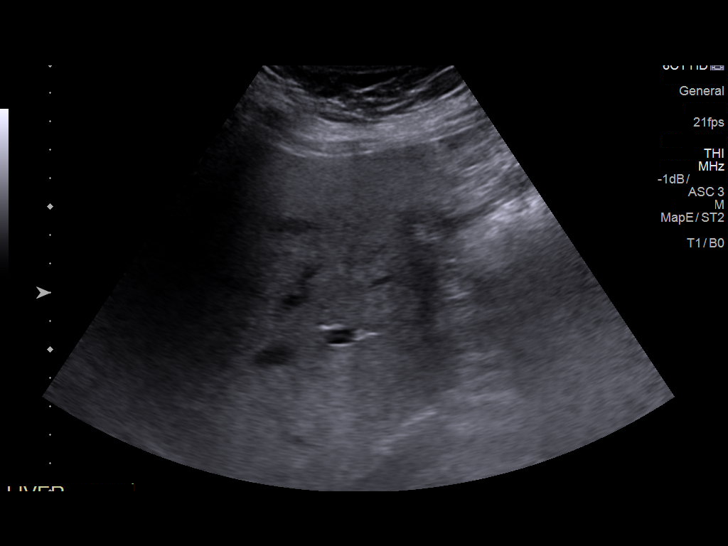

[13 of 25 positions shown; findings below may reference images not displayed]

FINDINGS: Gallbladder:

Within the gallbladder, there is a 7 mm echogenic focus which moves
and shadows consistent with cholelithiasis. There is a 3 mm
echogenic focus in the gallbladder which neither moves nor shadows,
a probable small polyp. There is no gallbladder wall thickening or
pericholecystic fluid. No sonographic Murphy sign noted by
sonographer.

Common bile duct:

Diameter: 5 mm. No intrahepatic or extrahepatic biliary duct
dilatation.

Liver:

No focal lesion identified. There is diffuse increase in liver
echogenicity with what is felt to represent mild fatty sparing near
the gallbladder fossa. Portal vein is patent on color Doppler
imaging with normal direction of blood flow towards the liver.
IMPRESSION: 1. Cholelithiasis. No gallbladder wall thickening or pericholecystic
fluid.

2. There is a 3 mm polyp in the gallbladder as well. A 3 mm in size
gallbladder polyp does not warrant additional imaging surveillance
per consensus guidelines.

3. Diffuse increase in liver echogenicity, felt to represent hepatic
steatosis. There is probable mild fatty sparing near the gallbladder
fossa. While no focal liver lesions beyond mild fatty sparing near
the gallbladder fossa are appreciated on this study, it must be
cautioned that the sensitivity of ultrasound for detection of focal
liver lesions is diminished in this circumstance.

## 2018-12-06 ENCOUNTER — Other Ambulatory Visit: Payer: Self-pay

## 2018-12-06 DIAGNOSIS — Z20828 Contact with and (suspected) exposure to other viral communicable diseases: Secondary | ICD-10-CM | POA: Diagnosis not present

## 2018-12-06 DIAGNOSIS — Z20822 Contact with and (suspected) exposure to covid-19: Secondary | ICD-10-CM

## 2018-12-07 LAB — NOVEL CORONAVIRUS, NAA: SARS-CoV-2, NAA: NOT DETECTED

## 2018-12-13 ENCOUNTER — Other Ambulatory Visit: Payer: Self-pay | Admitting: Family Medicine

## 2018-12-14 NOTE — Telephone Encounter (Signed)
Patient need to schedule an ov for more refills. 

## 2019-01-01 ENCOUNTER — Other Ambulatory Visit: Payer: Self-pay

## 2019-01-01 DIAGNOSIS — Z20822 Contact with and (suspected) exposure to covid-19: Secondary | ICD-10-CM

## 2019-01-02 ENCOUNTER — Encounter: Payer: Self-pay | Admitting: Family Medicine

## 2019-01-03 LAB — NOVEL CORONAVIRUS, NAA: SARS-CoV-2, NAA: DETECTED — AB

## 2019-05-09 DIAGNOSIS — Z20828 Contact with and (suspected) exposure to other viral communicable diseases: Secondary | ICD-10-CM | POA: Diagnosis not present

## 2020-01-17 ENCOUNTER — Other Ambulatory Visit: Payer: Self-pay

## 2020-01-17 DIAGNOSIS — Z20822 Contact with and (suspected) exposure to covid-19: Secondary | ICD-10-CM

## 2020-01-19 LAB — NOVEL CORONAVIRUS, NAA: SARS-CoV-2, NAA: NOT DETECTED

## 2020-01-19 LAB — SARS-COV-2, NAA 2 DAY TAT

## 2021-10-15 ENCOUNTER — Other Ambulatory Visit: Payer: Self-pay

## 2021-10-15 ENCOUNTER — Encounter (HOSPITAL_BASED_OUTPATIENT_CLINIC_OR_DEPARTMENT_OTHER): Payer: Self-pay

## 2021-10-15 ENCOUNTER — Emergency Department (HOSPITAL_BASED_OUTPATIENT_CLINIC_OR_DEPARTMENT_OTHER): Payer: 59

## 2021-10-15 ENCOUNTER — Emergency Department (HOSPITAL_BASED_OUTPATIENT_CLINIC_OR_DEPARTMENT_OTHER)
Admission: EM | Admit: 2021-10-15 | Discharge: 2021-10-15 | Disposition: A | Discharge status: Home / Self Care | Payer: 59 | Attending: Emergency Medicine | Admitting: Emergency Medicine

## 2021-10-15 DIAGNOSIS — Z794 Long term (current) use of insulin: Secondary | ICD-10-CM | POA: Diagnosis not present

## 2021-10-15 DIAGNOSIS — E119 Type 2 diabetes mellitus without complications: Secondary | ICD-10-CM | POA: Insufficient documentation

## 2021-10-15 DIAGNOSIS — M26621 Arthralgia of right temporomandibular joint: Secondary | ICD-10-CM | POA: Diagnosis not present

## 2021-10-15 DIAGNOSIS — Z79899 Other long term (current) drug therapy: Secondary | ICD-10-CM | POA: Diagnosis not present

## 2021-10-15 DIAGNOSIS — E039 Hypothyroidism, unspecified: Secondary | ICD-10-CM | POA: Diagnosis not present

## 2021-10-15 DIAGNOSIS — J45909 Unspecified asthma, uncomplicated: Secondary | ICD-10-CM | POA: Insufficient documentation

## 2021-10-15 DIAGNOSIS — Z91010 Allergy to peanuts: Secondary | ICD-10-CM | POA: Insufficient documentation

## 2021-10-15 DIAGNOSIS — Z7984 Long term (current) use of oral hypoglycemic drugs: Secondary | ICD-10-CM | POA: Insufficient documentation

## 2021-10-15 DIAGNOSIS — Z20822 Contact with and (suspected) exposure to covid-19: Secondary | ICD-10-CM | POA: Insufficient documentation

## 2021-10-15 DIAGNOSIS — R519 Headache, unspecified: Secondary | ICD-10-CM | POA: Diagnosis present

## 2021-10-15 LAB — COMPREHENSIVE METABOLIC PANEL
ALT: 25 U/L (ref 0–44)
AST: 24 U/L (ref 15–41)
Albumin: 4.4 g/dL (ref 3.5–5.0)
Alkaline Phosphatase: 67 U/L (ref 38–126)
Anion gap: 12 (ref 5–15)
BUN: 14 mg/dL (ref 6–20)
CO2: 23 mmol/L (ref 22–32)
Calcium: 9.7 mg/dL (ref 8.9–10.3)
Chloride: 99 mmol/L (ref 98–111)
Creatinine, Ser: 0.82 mg/dL (ref 0.61–1.24)
GFR, Estimated: >60 mL/min (ref >60)
Glucose, Bld: 338 mg/dL — ABNORMAL HIGH (ref 70–99)
Potassium: 4.3 mmol/L (ref 3.5–5.1)
Sodium: 134 mmol/L — ABNORMAL LOW (ref 135–145)
Total Bilirubin: 0.8 mg/dL (ref 0.3–1.2)
Total Protein: 7.6 g/dL (ref 6.5–8.1)

## 2021-10-15 LAB — CBC WITH DIFFERENTIAL/PLATELET
Abs Immature Granulocytes: 0.04 10*3/uL (ref 0.00–0.07)
Basophils Absolute: 0.1 10*3/uL (ref 0.0–0.1)
Basophils Relative: 1 %
Eosinophils Absolute: 0.2 10*3/uL (ref 0.0–0.5)
Eosinophils Relative: 3 %
HCT: 45.8 % (ref 39.0–52.0)
Hemoglobin: 16.2 g/dL (ref 13.0–17.0)
Immature Granulocytes: 1 %
Lymphocytes Relative: 24 %
Lymphs Abs: 2 10*3/uL (ref 0.7–4.0)
MCH: 31 pg (ref 26.0–34.0)
MCHC: 35.4 g/dL (ref 30.0–36.0)
MCV: 87.6 fL (ref 80.0–100.0)
Monocytes Absolute: 0.6 10*3/uL (ref 0.1–1.0)
Monocytes Relative: 7 %
Neutro Abs: 5.5 10*3/uL (ref 1.7–7.7)
Neutrophils Relative %: 64 %
Platelets: 250 10*3/uL (ref 150–400)
RBC: 5.23 MIL/uL (ref 4.22–5.81)
RDW: 12.2 % (ref 11.5–15.5)
WBC: 8.4 10*3/uL (ref 4.0–10.5)
nRBC: 0 % (ref 0.0–0.2)

## 2021-10-15 LAB — SARS CORONAVIRUS 2 BY RT PCR: SARS Coronavirus 2 by RT PCR: NEGATIVE

## 2021-10-15 MED ORDER — IOHEXOL 300 MG/ML  SOLN
80.0000 mL | Freq: Once | INTRAMUSCULAR | Status: AC | PRN
Start: 2021-10-15 — End: 2021-10-15
  Administered 2021-10-15: 80 mL via INTRAVENOUS

## 2021-10-15 NOTE — ED Provider Notes (Signed)
[facility] EMERGENCY DEPT Provider Note   CSN: [id number] Arrival date & time: 10/15/21  1606     History  Chief Complaint  Patient presents with   Facial Pain    Ryan Decker is a 56 y.o. male.  HPI     5 days congestion Sore throat top of palate Coughed up some mucus but more from throat and did not feel like it was from the chest  Jaw pain started  Thought maybe was clenching teeth getting phone with 56yo First noticed it yesterday when starting to move jaw Jaw pain with movement, couldn't close it all the way on that side although now it seems improved Swallowing water, slight twinge No tenderness in teeth, no cavities, no real headaches, no real fever Didn't feel like teeth were lining up   Any chewing motion jaw is painful, painful enough does not want to eat  Head congestion, sore throat improved, slight cough with throat irritation Some eye drainage  No cp, dyspnea, nausae, vomiting, Denies numbness, weakness, difficulty talking or walking, visual changes or facial droop.      Past Medical History:  Diagnosis Date   Asthma    Diabetes mellitus    sees Dr. Buddy Duty    GERD (gastroesophageal reflux disease)    Hyperlipidemia    Hypothyroidism     Home Medications Prior to Admission medications   Medication Sig Start Date End Date Taking? Authorizing Provider  albuterol (PROVENTIL HFA;VENTOLIN HFA) 108 (90 Base) MCG/ACT inhaler Inhale 2 puffs into the lungs every 4 (four) hours as needed for wheezing or shortness of breath. 01/28/17   Laurey Morale, MD  amoxicillin-clavulanate (AUGMENTIN) 875-125 MG tablet Take 1 tablet by mouth 2 (two) times daily. 04/01/17   Laurey Morale, MD  Cholecalciferol (VITAMIN D) 50 MCG (2000 UT) tablet TAKE 1 TABLET (2,000 UNITS TOTAL) BY MOUTH ONCE DAILY 05/15/18   Mansouraty, Telford Nab., MD  glucose blood (ONE TOUCH ULTRA TEST) test strip Use as instructed 11/30/12   Laurey Morale, MD  Insulin Degludec  (TRESIBA FLEXTOUCH [place]) Inject into the skin daily.    [provider]  Insulin Pen Needle 31G X 8 MM MISC 1 each by Does not apply route daily. 01/13/15   Laurey Morale, MD  Insulin Syringe-Needle U-100 (B-D INS SYRINGE 0.5CC/31GX5/16) 31G X 5/16" 0.5 ML MISC 1 Syringe by Does not apply route daily. 01/08/15   Laurey Morale, MD  levothyroxine (SYNTHROID, LEVOTHROID) 125 MCG tablet TAKE 1 TABLET (125 MCG TOTAL) BY MOUTH DAILY BEFORE BREAKFAST 11/23/17   Laurey Morale, MD  metFORMIN (GLUCOPHAGE) 1000 MG tablet Take 1 tablet (1,000 mg total) by mouth 2 (two) times daily with a meal. 10/28/15   Laurey Morale, MD  ondansetron (ZOFRAN) 8 MG tablet Take 1 tablet (8 mg total) by mouth every 8 (eight) hours as needed for nausea or vomiting. 03/28/17   Laurey Morale, MD  triamcinolone cream (KENALOG) 0.1 % Apply 1 application topically 3 (three) times daily. 01/28/17   Laurey Morale, MD  VICTOZA 18 MG/3ML SOPN INJECT 0.3MLS (1.8 MG TOTAL) INTO THE SKIN DAILY 07/09/15   Laurey Morale, MD      Allergies    Keflex [cephalexin] and Peanut-containing drug products    Review of Systems   Review of Systems  Physical Exam Updated Vital Signs BP 109/74   Pulse 92   Temp 97.8 F (36.6 C) (Oral)   Resp 18   Ht 5'  5.75" (1.67 m)   Wt 97.3 kg   SpO2 98%   BMI 34.89 kg/m  Physical Exam Vitals and nursing note reviewed.  Constitutional:      General: He is not in acute distress.    Appearance: He is well-developed. He is not diaphoretic.  HENT:     Head: Normocephalic and atraumatic.     Mouth/Throat:     Comments: Significant trismus, pain to right jaw Eyes:     Conjunctiva/sclera: Conjunctivae normal.     Pupils: Pupils are equal, round, and reactive to light.  Cardiovascular:     Rate and Rhythm: Normal rate and regular rhythm.     Heart sounds: Normal heart sounds. No murmur heard.    No friction rub. No gallop.  Pulmonary:     Effort: Pulmonary effort is normal. No respiratory  distress.     Breath sounds: Normal breath sounds. No wheezing or rales.  Abdominal:     General: There is no distension.     Palpations: Abdomen is soft.     Tenderness: There is no abdominal tenderness. There is no guarding.  Musculoskeletal:     Cervical back: Normal range of motion.  Skin:    General: Skin is warm and dry.  Neurological:     Mental Status: He is alert and oriented to person, place, and time.     ED Results / Procedures / Treatments   Labs (all labs ordered are listed, but only abnormal results are displayed) Labs Reviewed  COMPREHENSIVE METABOLIC PANEL - Abnormal; Notable for the following components:      Result Value   Sodium 134 (*)    Glucose, Bld 338 (*)    All other components within normal limits  SARS CORONAVIRUS 2 BY RT PCR  CBC WITH DIFFERENTIAL/PLATELET    EKG None  Radiology CT Soft Tissue Neck W Contrast  Result Date: 10/15/2021 CLINICAL DATA:  Initial evaluation for soft tissue swelling, infection suspected. EXAM: CT NECK WITH CONTRAST TECHNIQUE: Multidetector CT imaging of the neck was performed using the standard protocol following the bolus administration of intravenous contrast. RADIATION DOSE REDUCTION: This exam was performed according to the departmental dose-optimization program which includes automated exposure control, adjustment of the mA and/or kV according to patient size and/or use of iterative reconstruction technique. CONTRAST:  67m OMNIPAQUE IOHEXOL 300 MG/ML  SOLN COMPARISON:  None available. FINDINGS: Pharynx and larynx: Oral cavity within normal limits. No acute inflammatory changes seen about the dentition. Oropharynx and nasopharynx within normal limits. No evidence for acute tonsillitis or peritonsillar abscess. Parapharyngeal fat maintained. No retropharyngeal collection or swelling. Epiglottis within normal limits. Vallecula clear. Hypopharynx and supraglottic larynx within normal limits. Glottis symmetric and normal.  Subglottic airway widely patent clear. Salivary glands: Multiple scattered punctate sialoliths noted within the left parotid gland without evidence for acute sialoadenitis or abnormal ductal dilatation. Right parotid gland within normal limits. Submandibular glands also within normal limits. Thyroid: Normal. Lymph nodes: Note made of a left level II a lymph node measuring at the upper limits of normal at 1 cm in short axis. No overtly enlarged or pathologic adenopathy seen within the neck. Vascular: Normal intravascular enhancement seen throughout the neck. Limited intracranial: Unremarkable. Visualized orbits: Unremarkable. Mastoids and visualized paranasal sinuses: Mild scattered mucoperiosteal thickening present about the sphenoid ethmoidal and maxillary sinuses. Visualized paranasal sinuses are otherwise clear. Visualized mastoids and middle ear cavities are well pneumatized and free of fluid. Skeleton: No discrete or worrisome osseous lesions. Moderate left-sided  facet arthrosis present at C2-3. Upper chest: Visualized upper chest demonstrates no acute finding. Other: None. IMPRESSION: 1. Negative CT of the neck. No acute inflammatory changes or other acute abnormality identified. 2. Left parotid sialolithiasis without evidence for acute sialoadenitis. 3. Moderate left-sided facet arthrosis at C2-3. Electronically Signed   By: Jeannine Boga M.D.   On: 10/15/2021 18:57    Procedures Procedures    Medications Ordered in ED Medications  iohexol (OMNIPAQUE) 300 MG/ML solution 80 mL (80 mLs Intravenous Contrast Given 10/15/21 1825)    ED Course/ Medical Decision Making/ A&P                            56yo male with history of DM, hypothyroidism, hyperlipidemia, asthma who presents with concern for right jaw pain, sore throat and difficulty opening mouth.  Consider deep neck space or facail muscle abscess, TMJ, osteomyelitis.    Given significant trismus, ordered CT soft tissue neck which  was evaluated by me and radiology and shows no significant abnormalities. Labs evaluated by me show no significant abnormalities.  Given symptoms of pain began yesterday, no fever, do not have high suspicion at this time for osteomyelitis.  Recommend continued monitoring of symptoms, suspect likely TMJ dysfunction. Recommend PCP follow up. Patient discharged in stable condition with understanding of reasons to return.          Final Clinical Impression(s) / ED Diagnoses Final diagnoses:  Arthralgia of right temporomandibular joint    Rx / DC Orders ED Discharge Orders     None         Gareth Morgan, MD 10/17/21 0025

## 2021-10-15 NOTE — ED Triage Notes (Signed)
Patient here POV from Home.  Endorses More Recent History of URI Symptoms including Mainly Congestion and Dry Mouth.   No Known Fevers or Chills.   Sent by UC Today after being assessed and is sent for R/O Infection to Jaw/Facial Area.   NAD noted during Triage. A&Ox4. GCS 15. Ambulatory.

## 2021-10-30 ENCOUNTER — Encounter: Payer: Self-pay | Admitting: Gastroenterology

## 2021-11-11 ENCOUNTER — Ambulatory Visit (AMBULATORY_SURGERY_CENTER): Payer: Self-pay | Admitting: *Deleted

## 2021-11-11 VITALS — Ht 65.75 in | Wt 195.0 lb

## 2021-11-11 DIAGNOSIS — Z1211 Encounter for screening for malignant neoplasm of colon: Secondary | ICD-10-CM

## 2021-11-11 MED ORDER — NA SULFATE-K SULFATE-MG SULF 17.5-3.13-1.6 GM/177ML PO SOLN: 1.0000 | Freq: Once | ORAL | 0 refills | Status: AC

## 2021-11-11 NOTE — Progress Notes (Signed)
No egg or soy allergy known to patient  No issues known to pt with past sedation with any surgeries or procedures Patient denies ever being told they had issues or difficulty with intubation  No FH of Malignant Hyperthermia Pt is not on diet pills Pt is not on  home 02  Pt is not on blood thinners  Pt denies issues with constipation  No A fib or A flutter Have any cardiac testing pending--no Pt instructed to use [email or web address] or GoodRx for a price reduction on prep   

## 2021-12-01 ENCOUNTER — Encounter: Payer: Self-pay | Admitting: Gastroenterology

## 2021-12-10 ENCOUNTER — Encounter: Payer: Self-pay | Admitting: Gastroenterology

## 2021-12-10 ENCOUNTER — Ambulatory Visit (AMBULATORY_SURGERY_CENTER): Payer: 59 | Admitting: Gastroenterology

## 2021-12-10 VITALS — BP 104/68 | HR 83 | Temp 96.6°F | Resp 11 | Ht 65.0 in | Wt 195.0 lb

## 2021-12-10 DIAGNOSIS — Z1211 Encounter for screening for malignant neoplasm of colon: Secondary | ICD-10-CM | POA: Diagnosis present

## 2021-12-10 DIAGNOSIS — D123 Benign neoplasm of transverse colon: Secondary | ICD-10-CM | POA: Diagnosis not present

## 2021-12-10 DIAGNOSIS — D124 Benign neoplasm of descending colon: Secondary | ICD-10-CM | POA: Diagnosis not present

## 2021-12-10 DIAGNOSIS — D122 Benign neoplasm of ascending colon: Secondary | ICD-10-CM

## 2021-12-10 MED ORDER — SODIUM CHLORIDE 0.9 % IV SOLN: 500.0000 mL | INTRAVENOUS | Status: DC

## 2021-12-10 NOTE — Op Note (Signed)
[place] [place] Patient Name: Ryan Decker Procedure Date: 12/10/2021 11:30 AM MRN: [id number] Endoscopist: Nicki Reaper E. Candis Schatz , MD, [id number] Age: 56 Referring MD:  Date of Birth: January 12, 1966 Gender: Male Account #: [id number] Procedure:                Colonoscopy Indications:              Screening for colorectal malignant neoplasm, This                            is the patient's first colonoscopy Medicines:                Monitored Anesthesia Care Procedure:                Pre-Anesthesia Assessment:                           - Prior to the procedure, a History and Physical                            was performed, and patient medications and                            allergies were reviewed. The patient's tolerance of                            previous anesthesia was also reviewed. The risks                            and benefits of the procedure and the sedation                            options and risks were discussed with the patient.                            All questions were answered, and informed consent                            was obtained. Prior Anticoagulants: The patient has                            taken no anticoagulant or antiplatelet agents. ASA                            Grade Assessment: II - A patient with mild systemic                            disease. After reviewing the risks and benefits,                            the patient was deemed in satisfactory condition to                            undergo the procedure.  After obtaining informed consent, the colonoscope                            was passed under direct vision. Throughout the                            procedure, the patient's blood pressure, pulse, and                            oxygen saturations were monitored continuously. The                            Olympus CF-HQ190L [id number]) Colonoscope was                            introduced through  the anus and advanced to the the                            cecum, identified by appendiceal orifice and                            ileocecal valve. The colonoscopy was performed                            without difficulty. The patient tolerated the                            procedure well. The quality of the bowel                            preparation was adequate. The ileocecal valve,                            appendiceal orifice, and rectum were photographed.                            The bowel preparation used was SUPREP via split                            dose instruction. Scope In: 11:37:56 AM Scope Out: 37:10:62 AM Scope Withdrawal Time: 0 hours 16 minutes 36 seconds  Total Procedure Duration: 0 hours 18 minutes 42 seconds  Findings:                 The perianal and digital rectal examinations were                            normal. Pertinent negatives include normal                            sphincter tone and no palpable rectal lesions.                           A 6 mm polyp was found in the ascending colon. The  polyp was sessile. The polyp was removed with a                            cold snare. Resection and retrieval were complete.                            The pathology specimen was placed into Bottle                            Number 1. Estimated blood loss was minimal.                           A 5 mm polyp was found in the hepatic flexure. The                            polyp was sessile. The polyp was removed with a                            cold snare. Resection and retrieval were complete.                            Estimated blood loss was minimal. The pathology                            specimen was placed into Bottle Number 1. Estimated                            blood loss was minimal.                           Two sessile polyps were found in the transverse                            colon. The polyps were 3 to 4 mm in size.  These                            polyps were removed with a cold snare. Resection                            and retrieval were complete. Estimated blood loss                            was minimal. The pathology specimen was placed into                            Bottle Number 1. Estimated blood loss was minimal.                           A 7 mm polyp was found in the descending colon. The                            polyp was sessile. The polyp was  removed with a                            cold snare. Resection and retrieval were complete.                            The pathology specimen was placed into Bottle                            Number 2. Estimated blood loss was minimal.                           The exam was otherwise normal throughout the                            examined colon.                           The retroflexed view of the distal rectum and anal                            verge was normal and showed no anal or rectal                            abnormalities. Complications:            No immediate complications. Estimated Blood Loss:     Estimated blood loss was minimal. Impression:               - One 6 mm polyp in the ascending colon, removed                            with a cold snare. Resected and retrieved.                           - One 5 mm polyp at the hepatic flexure, removed                            with a cold snare. Resected and retrieved.                           - Two 3 to 4 mm polyps in the transverse colon,                            removed with a cold snare. Resected and retrieved.                           - One 7 mm polyp in the descending colon, removed                            with a cold snare. Resected and retrieved.                           - The distal rectum and anal verge are normal on  retroflexion view. Recommendation:           - Patient has a contact number available for                             emergencies. The signs and symptoms of potential                            delayed complications were discussed with the                            patient. Return to normal activities tomorrow.                            Written discharge instructions were provided to the                            patient.                           - Resume previous diet.                           - Continue present medications.                           - Await pathology results.                           - Repeat colonoscopy (date not yet determined) for                            surveillance based on pathology results. Kayliana Codd E. Candis Schatz, MD 12/10/2021 12:04:59 PM This report has been signed electronically.

## 2021-12-10 NOTE — Progress Notes (Signed)
Sedate, gd SR, tolerated procedure well, VSS, report to RN 

## 2021-12-10 NOTE — Progress Notes (Signed)
[facility] History and Physical   Primary Care Physician:  Laurey Morale, MD   Reason for Procedure:   Colon cancer screening   Plan:    Screening colonoscopy     HPI: Ryan Decker is a 56 y.o. male undergoing initial average risk screening colonoscopy.  He has no family history of colon cancer and no chronic GI symptoms.    Past Medical History:  Diagnosis Date   Allergy    Asthma    Diabetes mellitus    sees Dr. Buddy Duty    GERD (gastroesophageal reflux disease)    Hyperlipidemia    Hypothyroidism     Past Surgical History:  Procedure Laterality Date   URETHRA SURGERY     as a small child    Prior to Admission medications   Medication Sig Start Date End Date Taking? Authorizing Provider  BD INSULIN SYRINGE U/F 30G X 1/2" 0.5 ML MISC 1 SYRINGE THREE TIMES A DAY AS NEEDED 10/26/21  Yes [provider]  cetirizine (ZYRTEC) 10 MG tablet Take 10 mg by mouth daily.   Yes [provider]  dextromethorphan-guaiFENesin (MUCINEX DM) 30-600 MG 12hr tablet Take 1 tablet by mouth daily.   Yes [provider]  glucose blood (ONE TOUCH ULTRA TEST) test strip Use as instructed 11/30/12  Yes Laurey Morale, MD  levothyroxine (SYNTHROID, LEVOTHROID) 125 MCG tablet TAKE 1 TABLET (125 MCG TOTAL) BY MOUTH DAILY BEFORE BREAKFAST 11/23/17  Yes Laurey Morale, MD  metFORMIN (GLUCOPHAGE) 1000 MG tablet Take 1 tablet (1,000 mg total) by mouth 2 (two) times daily with a meal. 10/28/15  Yes Laurey Morale, MD  MOUNJARO 2.5 MG/0.5ML Pen SMARTSIG:2.5 Milligram(s) SUB-Q Once a Week 10/29/21  Yes [provider]  NOVOLOG 100 UNIT/ML injection SMARTSIG:10-20 Unit(s) SUB-Q PRN 10/29/21  Yes [provider]  albuterol (PROVENTIL HFA;VENTOLIN HFA) 108 (90 Base) MCG/ACT inhaler Inhale 2 puffs into the lungs every 4 (four) hours as needed for wheezing or shortness of breath. 01/28/17   Laurey Morale, MD  amoxicillin-clavulanate (AUGMENTIN) 875-125 MG  tablet Take 1 tablet by mouth 2 (two) times daily. Patient not taking: Reported on 12/10/2021 04/01/17   Laurey Morale, MD  Cholecalciferol (VITAMIN D) 50 MCG (2000 UT) tablet TAKE 1 TABLET (2,000 UNITS TOTAL) BY MOUTH ONCE DAILY Patient not taking: Reported on 12/10/2021 05/15/18   Mansouraty, Telford Nab., MD  Insulin Degludec (TRESIBA FLEXTOUCH [place]) Inject into the skin daily. Patient not taking: Reported on 12/10/2021    [provider]  Insulin Pen Needle 31G X 8 MM MISC 1 each by Does not apply route daily. Patient not taking: Reported on 12/10/2021 01/13/15   Laurey Morale, MD  Insulin Syringe-Needle U-100 (B-D INS SYRINGE 0.5CC/31GX5/16) 31G X 5/16" 0.5 ML MISC 1 Syringe by Does not apply route daily. Patient not taking: Reported on 12/10/2021 01/08/15   Laurey Morale, MD  ondansetron [facility]) 8 MG tablet Take 1 tablet (8 mg total) by mouth every 8 (eight) hours as needed for nausea or vomiting. 03/28/17   Laurey Morale, MD  triamcinolone cream (KENALOG) 0.1 % Apply 1 application topically 3 (three) times daily. 01/28/17   Laurey Morale, MD  VICTOZA 18 MG/3ML SOPN INJECT 0.3MLS (1.8 MG TOTAL) INTO THE SKIN DAILY Patient not taking: Reported on 12/10/2021 07/09/15   Laurey Morale, MD    Current Outpatient Medications  Medication Sig Dispense Refill   BD INSULIN SYRINGE U/F 30G X 1/2" 0.5 ML  MISC 1 SYRINGE THREE TIMES A DAY AS NEEDED     cetirizine (ZYRTEC) 10 MG tablet Take 10 mg by mouth daily.     dextromethorphan-guaiFENesin (MUCINEX DM) 30-600 MG 12hr tablet Take 1 tablet by mouth daily.     glucose blood (ONE TOUCH ULTRA TEST) test strip Use as instructed 100 each 11   levothyroxine (SYNTHROID, LEVOTHROID) 125 MCG tablet TAKE 1 TABLET (125 MCG TOTAL) BY MOUTH DAILY BEFORE BREAKFAST 90 tablet 2   metFORMIN (GLUCOPHAGE) 1000 MG tablet Take 1 tablet (1,000 mg total) by mouth 2 (two) times daily with a meal. 180 tablet 3   MOUNJARO 2.5 MG/0.5ML Pen SMARTSIG:2.5 Milligram(s)  SUB-Q Once a Week     NOVOLOG 100 UNIT/ML injection SMARTSIG:10-20 Unit(s) SUB-Q PRN     albuterol (PROVENTIL HFA;VENTOLIN HFA) 108 (90 Base) MCG/ACT inhaler Inhale 2 puffs into the lungs every 4 (four) hours as needed for wheezing or shortness of breath. 1 Inhaler 5   amoxicillin-clavulanate (AUGMENTIN) 875-125 MG tablet Take 1 tablet by mouth 2 (two) times daily. (Patient not taking: Reported on 12/10/2021) 20 tablet 0   Cholecalciferol (VITAMIN D) 50 MCG (2000 UT) tablet TAKE 1 TABLET (2,000 UNITS TOTAL) BY MOUTH ONCE DAILY (Patient not taking: Reported on 12/10/2021) 30 tablet 11   Insulin Degludec (TRESIBA FLEXTOUCH [place]) Inject into the skin daily. (Patient not taking: Reported on 12/10/2021)     Insulin Pen Needle 31G X 8 MM MISC 1 each by Does not apply route daily. (Patient not taking: Reported on 12/10/2021) 100 each 3   Insulin Syringe-Needle U-100 (B-D INS SYRINGE 0.5CC/31GX5/16) 31G X 5/16" 0.5 ML MISC 1 Syringe by Does not apply route daily. (Patient not taking: Reported on 12/10/2021) 90 each 3   ondansetron (ZOFRAN) 8 MG tablet Take 1 tablet (8 mg total) by mouth every 8 (eight) hours as needed for nausea or vomiting. 30 tablet 0   triamcinolone cream (KENALOG) 0.1 % Apply 1 application topically 3 (three) times daily. 45 g 5   VICTOZA 18 MG/3ML SOPN INJECT 0.3MLS (1.8 MG TOTAL) INTO THE SKIN DAILY (Patient not taking: Reported on 12/10/2021) 9 mL 0   Current Facility-Administered Medications  Medication Dose Route Frequency Provider Last Rate Last Admin   0.9 %  sodium chloride infusion  500 mL Intravenous Continuous Daryel November, MD        Allergies as of 12/10/2021 - Review Complete 12/10/2021  Allergen Reaction Noted   Keflex [cephalexin]  10/07/2011   Peanut-containing drug products Other (See Comments) 10/15/2021    Family History  Problem Relation Age of Onset   Colon cancer Neg Hx    Stomach cancer Neg Hx    Esophageal cancer Neg Hx     Social History    Socioeconomic History   Marital status: Married    Spouse name: Not on file   Number of children: Not on file   Years of education: Not on file   Highest education level: Not on file  Occupational History   Not on file  Tobacco Use   Smoking status: Never   Smokeless tobacco: Never  Substance and Sexual Activity   Alcohol use: No    Alcohol/week: 0.0 standard drinks of alcohol   Drug use: No   Sexual activity: Not on file  Other Topics Concern   Not on file  Social History Narrative   Not on file   Social Determinants of Health   Financial Resource Strain: Not on file  Food Insecurity:  Not on file  Transportation Needs: Not on file  Physical Activity: Not on file  Stress: Not on file  Social Connections: Not on file  Intimate Partner Violence: Not on file    Review of Systems:  All other review of systems negative except as mentioned in the HPI.  Physical Exam: Vital signs BP 121/64   Pulse 78   Temp (!) 96.6 F (35.9 C)   Ht '5\' 5"'$  (1.651 m)   Wt 195 lb (88.5 kg)   SpO2 98%   BMI 32.45 kg/m   General:   Alert,  Well-developed, well-nourished, pleasant and cooperative in NAD Airway:  Mallampati 2 Lungs:  Clear throughout to auscultation.   Heart:  Regular rate and rhythm; no murmurs, clicks, rubs,  or gallops. Abdomen:  Soft, nontender and nondistended. Normal bowel sounds.   Neuro/Psych:  Normal mood and affect. A and O x 3   Toryn Mcclinton E. Candis Schatz, MD [facility]

## 2021-12-10 NOTE — Progress Notes (Signed)
Pt's states no medical or surgical changes since previsit or office visit. 

## 2021-12-10 NOTE — Progress Notes (Signed)
Called to room to assist during endoscopic procedure.  Patient ID and intended procedure confirmed with present staff. Received instructions for my participation in the procedure from the performing physician.  

## 2021-12-10 NOTE — Patient Instructions (Signed)
Handout provided on polyps.   Continue present medications. Await pathology results.   Repeat colonoscopy (date not yet determined) for surveillance based on pathology results.   YOU HAD AN ENDOSCOPIC PROCEDURE TODAY AT [facility]:   Refer to the procedure report that was given to you for any specific questions about what was found during the examination.  If the procedure report does not answer your questions, please call your gastroenterologist to clarify.  If you requested that your care partner not be given the details of your procedure findings, then the procedure report has been included in a sealed envelope for you to review at your convenience later.  YOU SHOULD EXPECT: Some feelings of bloating in the abdomen. Passage of more gas than usual.  Walking can help get rid of the air that was put into your GI tract during the procedure and reduce the bloating. If you had a lower endoscopy (such as a colonoscopy or flexible sigmoidoscopy) you may notice spotting of blood in your stool or on the toilet paper. If you underwent a bowel prep for your procedure, you may not have a normal bowel movement for a few days.  Please Note:  You might notice some irritation and congestion in your nose or some drainage.  This is from the oxygen used during your procedure.  There is no need for concern and it should clear up in a day or so.  SYMPTOMS TO REPORT IMMEDIATELY:  Following lower endoscopy (colonoscopy or flexible sigmoidoscopy):  Excessive amounts of blood in the stool  Significant tenderness or worsening of abdominal pains  Swelling of the abdomen that is new, acute  Fever of 100F or higher  For urgent or emergent issues, a gastroenterologist can be reached at any hour by calling [phone]. Do not use MyChart messaging for urgent concerns.    DIET:  We do recommend a small meal at first, but then you may proceed to your regular diet.  Drink plenty of fluids but you  should avoid alcoholic beverages for 24 hours.  ACTIVITY:  You should plan to take it easy for the rest of today and you should NOT DRIVE or use heavy machinery until tomorrow (because of the sedation medicines used during the test).    FOLLOW UP: Our staff will call the number listed on your records the next business day following your procedure.  We will call around 7:15- 8:00 am to check on you and address any questions or concerns that you may have regarding the information given to you following your procedure. If we do not reach you, we will leave a message.     If any biopsies were taken you will be contacted by phone or by letter within the next 1-3 weeks.  Please call us at [phone] if you have not heard about the biopsies in 3 weeks.    SIGNATURES/CONFIDENTIALITY: You and/or your care partner have signed paperwork which will be entered into your electronic medical record.  These signatures attest to the fact that that the information above on your After Visit Summary has been reviewed and is understood.  Full responsibility of the confidentiality of this discharge information lies with you and/or your care-partner.

## 2021-12-11 ENCOUNTER — Telehealth: Payer: Self-pay | Admitting: *Deleted

## 2021-12-11 NOTE — Telephone Encounter (Signed)
  Follow up Call-     12/10/2021   11:07 AM 12/10/2021   11:05 AM  Call back number  Post procedure Call Back phone  # [phone]  Permission to leave phone message Yes Yes     Patient questions:  Do you have a fever, pain , or abdominal swelling? No. Pain Score  0 *  Have you tolerated food without any problems? Yes.    Have you been able to return to your normal activities? Yes.    Do you have any questions about your discharge instructions: Diet   No. Medications  No. Follow up visit  No.  Do you have questions or concerns about your Care? No.  Actions: * If pain score is 4 or above: No action needed, pain <4.

## 2021-12-18 NOTE — Progress Notes (Signed)
Ryan Decker,   The five polyps that I removed during your recent procedure were completely benign but were proven to be "pre-cancerous" polyps that MAY have grown into cancers if they had not been removed.  Studies shows that at least 20% of women over age 57 and 30% of men over age 8 have pre-cancerous polyps.  Based on current nationally recognized surveillance guidelines, I recommend that you have a repeat colonoscopy in 3 years.   If you develop any new rectal bleeding, abdominal pain or significant bowel habit changes, please contact me before then.

## 2022-01-23 ENCOUNTER — Encounter (HOSPITAL_BASED_OUTPATIENT_CLINIC_OR_DEPARTMENT_OTHER): Payer: Self-pay | Admitting: Emergency Medicine

## 2022-01-23 ENCOUNTER — Other Ambulatory Visit: Payer: Self-pay

## 2022-01-23 ENCOUNTER — Emergency Department (HOSPITAL_BASED_OUTPATIENT_CLINIC_OR_DEPARTMENT_OTHER): Payer: 59

## 2022-01-23 ENCOUNTER — Emergency Department (HOSPITAL_BASED_OUTPATIENT_CLINIC_OR_DEPARTMENT_OTHER)
Admission: EM | Admit: 2022-01-23 | Discharge: 2022-01-24 | Disposition: A | Discharge status: Home / Self Care | Payer: 59 | Attending: Emergency Medicine | Admitting: Emergency Medicine

## 2022-01-23 ENCOUNTER — Emergency Department (HOSPITAL_BASED_OUTPATIENT_CLINIC_OR_DEPARTMENT_OTHER): Payer: 59 | Admitting: Radiology

## 2022-01-23 DIAGNOSIS — E876 Hypokalemia: Secondary | ICD-10-CM | POA: Diagnosis not present

## 2022-01-23 DIAGNOSIS — Z79899 Other long term (current) drug therapy: Secondary | ICD-10-CM | POA: Diagnosis not present

## 2022-01-23 DIAGNOSIS — Z20822 Contact with and (suspected) exposure to covid-19: Secondary | ICD-10-CM | POA: Diagnosis not present

## 2022-01-23 DIAGNOSIS — R000 Tachycardia, unspecified: Secondary | ICD-10-CM | POA: Insufficient documentation

## 2022-01-23 DIAGNOSIS — E1165 Type 2 diabetes mellitus with hyperglycemia: Secondary | ICD-10-CM | POA: Diagnosis not present

## 2022-01-23 DIAGNOSIS — R739 Hyperglycemia, unspecified: Secondary | ICD-10-CM | POA: Insufficient documentation

## 2022-01-23 DIAGNOSIS — R791 Abnormal coagulation profile: Secondary | ICD-10-CM | POA: Diagnosis not present

## 2022-01-23 DIAGNOSIS — N179 Acute kidney failure, unspecified: Secondary | ICD-10-CM | POA: Diagnosis not present

## 2022-01-23 DIAGNOSIS — Z794 Long term (current) use of insulin: Secondary | ICD-10-CM | POA: Diagnosis not present

## 2022-01-23 DIAGNOSIS — R197 Diarrhea, unspecified: Secondary | ICD-10-CM | POA: Diagnosis present

## 2022-01-23 DIAGNOSIS — Z91010 Allergy to peanuts: Secondary | ICD-10-CM | POA: Diagnosis not present

## 2022-01-23 DIAGNOSIS — Z7951 Long term (current) use of inhaled steroids: Secondary | ICD-10-CM | POA: Insufficient documentation

## 2022-01-23 DIAGNOSIS — J45909 Unspecified asthma, uncomplicated: Secondary | ICD-10-CM | POA: Diagnosis not present

## 2022-01-23 DIAGNOSIS — Z7984 Long term (current) use of oral hypoglycemic drugs: Secondary | ICD-10-CM | POA: Insufficient documentation

## 2022-01-23 DIAGNOSIS — E039 Hypothyroidism, unspecified: Secondary | ICD-10-CM | POA: Insufficient documentation

## 2022-01-23 DIAGNOSIS — K529 Noninfective gastroenteritis and colitis, unspecified: Secondary | ICD-10-CM | POA: Insufficient documentation

## 2022-01-23 LAB — COMPREHENSIVE METABOLIC PANEL
ALT: 23 U/L (ref 0–44)
AST: 17 U/L (ref 15–41)
Albumin: 3.8 g/dL (ref 3.5–5.0)
Alkaline Phosphatase: 58 U/L (ref 38–126)
Anion gap: 15 (ref 5–15)
BUN: 22 mg/dL — ABNORMAL HIGH (ref 6–20)
CO2: 20 mmol/L — ABNORMAL LOW (ref 22–32)
Calcium: 7.9 mg/dL — ABNORMAL LOW (ref 8.9–10.3)
Chloride: 98 mmol/L (ref 98–111)
Creatinine, Ser: 1.27 mg/dL — ABNORMAL HIGH (ref 0.61–1.24)
GFR, Estimated: >60 mL/min (ref >60)
Glucose, Bld: 262 mg/dL — ABNORMAL HIGH (ref 70–99)
Potassium: 3.4 mmol/L — ABNORMAL LOW (ref 3.5–5.1)
Sodium: 133 mmol/L — ABNORMAL LOW (ref 135–145)
Total Bilirubin: 1 mg/dL (ref 0.3–1.2)
Total Protein: 6.3 g/dL — ABNORMAL LOW (ref 6.5–8.1)

## 2022-01-23 LAB — CBC
HCT: 52.5 % — ABNORMAL HIGH (ref 39.0–52.0)
Hemoglobin: 18.7 g/dL — ABNORMAL HIGH (ref 13.0–17.0)
MCH: 30.8 pg (ref 26.0–34.0)
MCHC: 35.6 g/dL (ref 30.0–36.0)
MCV: 86.5 fL (ref 80.0–100.0)
Platelets: 354 10*3/uL (ref 150–400)
RBC: 6.07 MIL/uL — ABNORMAL HIGH (ref 4.22–5.81)
RDW: 12.8 % (ref 11.5–15.5)
WBC: 7.1 10*3/uL (ref 4.0–10.5)
nRBC: 0 % (ref 0.0–0.2)

## 2022-01-23 LAB — RESP PANEL BY RT-PCR (RSV, FLU A&B, COVID)  RVPGX2
Influenza A by PCR: NEGATIVE
Influenza B by PCR: NEGATIVE
Resp Syncytial Virus by PCR: NEGATIVE
SARS Coronavirus 2 by RT PCR: NEGATIVE

## 2022-01-23 LAB — CBG MONITORING, ED: Glucose-Capillary: 241 mg/dL — ABNORMAL HIGH (ref 70–99)

## 2022-01-23 LAB — URINALYSIS, ROUTINE W REFLEX MICROSCOPIC
Glucose, UA: 500 mg/dL — AB
Hgb urine dipstick: NEGATIVE
Ketones, ur: 40 mg/dL — AB
Leukocytes,Ua: NEGATIVE
Nitrite: NEGATIVE
Protein, ur: 30 mg/dL — AB
Specific Gravity, Urine: 1.032 — ABNORMAL HIGH (ref 1.005–1.030)
pH: 5.5 (ref 5.0–8.0)

## 2022-01-23 LAB — LACTIC ACID, PLASMA
Lactic Acid, Venous: 2.6 mmol/L (ref 0.5–1.9)
Lactic Acid, Venous: 2.6 mmol/L (ref 0.5–1.9)

## 2022-01-23 LAB — LIPASE, BLOOD: Lipase: 14 U/L (ref 11–51)

## 2022-01-23 LAB — D-DIMER, QUANTITATIVE: D-Dimer, Quant: 0.62 ug/mL-FEU — ABNORMAL HIGH (ref 0.00–0.50)

## 2022-01-23 MED ORDER — LACTATED RINGERS IV BOLUS
1000.0000 mL | Freq: Once | INTRAVENOUS | Status: AC
  Administered 2022-01-23: 1000 mL via INTRAVENOUS

## 2022-01-23 MED ORDER — IOHEXOL 350 MG/ML SOLN
75.0000 mL | Freq: Once | INTRAVENOUS | Status: AC | PRN
  Administered 2022-01-23: 75 mL via INTRAVENOUS

## 2022-01-23 MED ORDER — ONDANSETRON HCL 4 MG/2ML IJ SOLN
4.0000 mg | Freq: Once | INTRAMUSCULAR | Status: AC
  Administered 2022-01-23: 4 mg via INTRAVENOUS
  Filled 2022-01-23: qty 2

## 2022-01-23 NOTE — ED Notes (Signed)
Unable to obtain temp, orally or axillary. Temporal 95.

## 2022-01-23 NOTE — ED Triage Notes (Signed)
Pt presents to ED POV. Pt c/o n/v/d, dizziness, and SOB since wed morning. Pt reports not able to keep even water down.

## 2022-01-23 NOTE — ED Provider Notes (Signed)
[facility] EMERGENCY DEPT Provider Note   CSN: [id number] Arrival date & time: 01/23/22  1807     History  Chief Complaint  Patient presents with   Emesis    Ryan Decker is a 56 y.o. male.   Emesis Associated symptoms: diarrhea      56 year old male with medical history significant for asthma, GERD, HLD, hypothyroidism, DM 2 who presents to the emergency department with nausea, vomiting, diarrhea and lightheadedness since Wednesday morning.  He also endorses some shortness of breath.  He states that he has not been able to keep anything down at home.  He has tried Gatorade without success.  He has tried water.  He denies any abdominal pain.  He denies any blood in his vomit or stool.  He denies any bile.  He states that he is passing gas.  He denies any fevers.  He also endorses some shortness of breath associated with his symptoms.  He denies any chest pain.  Home Medications Prior to Admission medications   Medication Sig Start Date End Date Taking? Authorizing Provider  albuterol (PROVENTIL HFA;VENTOLIN HFA) 108 (90 Base) MCG/ACT inhaler Inhale 2 puffs into the lungs every 4 (four) hours as needed for wheezing or shortness of breath. 01/28/17   Laurey Morale, MD  amoxicillin-clavulanate (AUGMENTIN) 875-125 MG tablet Take 1 tablet by mouth 2 (two) times daily. Patient not taking: Reported on 12/10/2021 04/01/17   Laurey Morale, MD  BD INSULIN SYRINGE U/F 30G X 1/2" 0.5 ML MISC 1 SYRINGE THREE TIMES A DAY AS NEEDED 10/26/21   [provider]  cetirizine (ZYRTEC) 10 MG tablet Take 10 mg by mouth daily.    [provider]  Cholecalciferol (VITAMIN D) 50 MCG (2000 UT) tablet TAKE 1 TABLET (2,000 UNITS TOTAL) BY MOUTH ONCE DAILY Patient not taking: Reported on 12/10/2021 05/15/18   Mansouraty, Telford Nab., MD  dextromethorphan-guaiFENesin [facility] DM) 30-600 MG 12hr tablet Take 1 tablet by mouth daily.    [provider]  glucose blood (ONE  TOUCH ULTRA TEST) test strip Use as instructed 11/30/12   Laurey Morale, MD  Insulin Degludec (TRESIBA FLEXTOUCH ) Inject into the skin daily. Patient not taking: Reported on 12/10/2021    [provider]  Insulin Pen Needle 31G X 8 MM MISC 1 each by Does not apply route daily. Patient not taking: Reported on 12/10/2021 01/13/15   Laurey Morale, MD  Insulin Syringe-Needle U-100 (B-D INS SYRINGE 0.5CC/31GX5/16) 31G X 5/16" 0.5 ML MISC 1 Syringe by Does not apply route daily. Patient not taking: Reported on 12/10/2021 01/08/15   Laurey Morale, MD  levothyroxine (SYNTHROID, LEVOTHROID) 125 MCG tablet TAKE 1 TABLET (125 MCG TOTAL) BY MOUTH DAILY BEFORE BREAKFAST 11/23/17   Laurey Morale, MD  metFORMIN (GLUCOPHAGE) 1000 MG tablet Take 1 tablet (1,000 mg total) by mouth 2 (two) times daily with a meal. 10/28/15   Laurey Morale, MD  MOUNJARO 2.5 MG/0.5ML Pen SMARTSIG:2.5 Milligram(s) SUB-Q Once a Week 10/29/21   [provider]  NOVOLOG 100 UNIT/ML injection SMARTSIG:10-20 Unit(s) SUB-Q PRN 10/29/21   [provider]  ondansetron (ZOFRAN) 8 MG tablet Take 1 tablet (8 mg total) by mouth every 8 (eight) hours as needed for nausea or vomiting. 03/28/17   Laurey Morale, MD  triamcinolone cream (KENALOG) 0.1 % Apply 1 application topically 3 (three) times daily. 01/28/17   Laurey Morale, MD  VICTOZA 18 MG/3ML SOPN INJECT 0.3MLS (1.8 MG TOTAL) INTO  THE SKIN DAILY Patient not taking: Reported on 12/10/2021 07/09/15   Laurey Morale, MD      Allergies    Keflex [cephalexin] and Peanut-containing drug products    Review of Systems   Review of Systems  Respiratory:  Positive for shortness of breath.   Gastrointestinal:  Positive for diarrhea, nausea and vomiting.  All other systems reviewed and are negative.   Physical Exam Updated Vital Signs BP 109/68   Pulse 95   Temp 98.2 F (36.8 C) (Oral)   Resp 16   SpO2 100%  Physical Exam Vitals and nursing note reviewed.   Constitutional:      General: He is not in acute distress.    Appearance: He is well-developed.  HENT:     Head: Normocephalic and atraumatic.     Mouth/Throat:     Mouth: Mucous membranes are dry.  Eyes:     Conjunctiva/sclera: Conjunctivae normal.  Cardiovascular:     Rate and Rhythm: Regular rhythm. Tachycardia present.  Pulmonary:     Effort: Pulmonary effort is normal. No respiratory distress.     Breath sounds: Normal breath sounds.  Abdominal:     Palpations: Abdomen is soft.     Tenderness: There is no abdominal tenderness. There is no guarding or rebound.  Musculoskeletal:        General: No swelling.     Cervical back: Neck supple.  Skin:    General: Skin is warm and dry.     Capillary Refill: Capillary refill takes less than 2 seconds.  Neurological:     Mental Status: He is alert.  Psychiatric:        Mood and Affect: Mood normal.     ED Results / Procedures / Treatments   Labs (all labs ordered are listed, but only abnormal results are displayed) Labs Reviewed  COMPREHENSIVE METABOLIC PANEL - Abnormal; Notable for the following components:      Result Value   Sodium 133 (*)    Potassium 3.4 (*)    CO2 20 (*)    Glucose, Bld 262 (*)    BUN 22 (*)    Creatinine, Ser 1.27 (*)    Calcium 7.9 (*)    Total Protein 6.3 (*)    All other components within normal limits  CBC - Abnormal; Notable for the following components:   RBC 6.07 (*)    Hemoglobin 18.7 (*)    HCT 52.5 (*)    All other components within normal limits  URINALYSIS, ROUTINE W REFLEX MICROSCOPIC - Abnormal; Notable for the following components:   APPearance HAZY (*)    Specific Gravity, Urine 1.032 (*)    Glucose, UA 500 (*)    Bilirubin Urine SMALL (*)    Ketones, ur 40 (*)    Protein, ur 30 (*)    All other components within normal limits  LACTIC ACID, PLASMA - Abnormal; Notable for the following components:   Lactic Acid, Venous 2.6 (*)    All other components within normal limits   LACTIC ACID, PLASMA - Abnormal; Notable for the following components:   Lactic Acid, Venous 2.6 (*)    All other components within normal limits  D-DIMER, QUANTITATIVE - Abnormal; Notable for the following components:   D-Dimer, Quant 0.62 (*)    All other components within normal limits  CBG MONITORING, ED - Abnormal; Notable for the following components:   Glucose-Capillary 241 (*)    All other components within normal limits  RESP PANEL  BY RT-PCR (RSV, FLU A&B, COVID)  [id number]  GASTROINTESTINAL PANEL BY PCR, STOOL (REPLACES STOOL CULTURE)  C DIFFICILE QUICK SCREEN W PCR REFLEX    LIPASE, BLOOD  LEGIONELLA PNEUMOPHILA SEROGP 1 UR AG    EKG EKG Interpretation  Date/Time:  Saturday January 23 2022 18:36:48 EST Ventricular Rate:  119 PR Interval:  138 QRS Duration: 80 QT Interval:  338 QTC Calculation: 475 R Axis:   59 Text Interpretation: Sinus tachycardia with occasional Premature ventricular complexes Cannot rule out Anterior infarct , age undetermined Abnormal ECG No previous ECGs available Confirmed by Regan Lemming ([id number]) on 01/23/2022 8:47:46 PM  Radiology CT Angio Chest PE W and/or Wo Contrast  Result Date: 01/23/2022 CLINICAL DATA:  Pulmonary embolism suspected, low to intermediate probability. Positive D-dimer. Shortness of breath with nausea, vomiting, and dizziness. EXAM: CT ANGIOGRAPHY CHEST WITH CONTRAST TECHNIQUE: Multidetector CT imaging of the chest was performed using the standard protocol during bolus administration of intravenous contrast. Multiplanar CT image reconstructions and MIPs were obtained to evaluate the vascular anatomy. RADIATION DOSE REDUCTION: This exam was performed according to the departmental dose-optimization program which includes automated exposure control, adjustment of the mA and/or kV according to patient size and/or use of iterative reconstruction technique. CONTRAST:  45m OMNIPAQUE IOHEXOL 350 MG/ML SOLN COMPARISON:  None Available.  FINDINGS: Cardiovascular: The heart is normal in size and there is no pericardial effusion. Multi-vessel coronary artery calcifications are noted. The aorta and pulmonary trunk are normal in caliber. No pulmonary artery filling defect. Mediastinum/Nodes: No enlarged mediastinal, hilar, or axillary lymph nodes. Thyroid gland, trachea, and esophagus demonstrate no significant findings. Lungs/Pleura: Lungs are clear. No pleural effusion or pneumothorax. Upper Abdomen: Fatty infiltration of the liver is noted. A stone is present in the gallbladder. Aortic atherosclerosis of the abdominal aorta. No acute abnormality. Musculoskeletal: Degenerative changes in the thoracic spine. No acute osseous abnormality is seen. Review of the MIP images confirms the above findings. IMPRESSION: 1. No evidence of pulmonary embolism or other acute process. 2. Hepatic steatosis. 3. Cholelithiasis. 4. Coronary artery calcification. 5. Aortic atherosclerosis. Electronically Signed   By: LBrett FairyM.D.   On: 01/23/2022 22:35   DG Chest 2 View  Result Date: 01/23/2022 CLINICAL DATA:  Shortness of breath EXAM: CHEST - 2 VIEW COMPARISON:  10/21/2006 FINDINGS: The heart size and mediastinal contours are within normal limits. Both lungs are clear. The visualized skeletal structures are unremarkable. IMPRESSION: No active cardiopulmonary disease. Electronically Signed   By: MInez CatalinaM.D.   On: 01/23/2022 21:06    Procedures Procedures    Medications Ordered in ED Medications  lactated ringers bolus 1,000 mL (1,000 mLs Intravenous New Bag/Given 01/23/22 2050)  ondansetron (ZOFRAN) injection 4 mg (4 mg Intravenous Given 01/23/22 2051)  lactated ringers bolus 1,000 mL (1,000 mLs Intravenous New Bag/Given 01/23/22 2236)  iohexol (OMNIPAQUE) 350 MG/ML injection 75 mL (75 mLs Intravenous Contrast Given 01/23/22 2215)    ED Course/ Medical Decision Making/ A&P Clinical Course as of 01/24/22 0001  Sat Jan 23, 2022  2152 Lactic  Acid, Venous(!!): 2.6 [JL]  2152 D-Dimer, Quant(!): 0.62 [JL]  2152 Creatinine(!): 1.27 [JL]    Clinical Course User Index [JL] LRegan Lemming MD                           Medical Decision Making Amount and/or Complexity of Data Reviewed Labs: ordered. Decision-making details documented in ED Course. Radiology: ordered.  Risk Prescription drug  management.     56 year old male with medical history significant for asthma, GERD, HLD, hypothyroidism, DM 2 who presents to the emergency department with nausea, vomiting, diarrhea and lightheadedness since Wednesday morning.  He also endorses some shortness of breath.  He states that he has not been able to keep anything down at home.  He has tried Gatorade without success.  He has tried water.  He denies any abdominal pain.  He denies any blood in his vomit or stool.  He denies any bile.  He states that he is passing gas.  He denies any fevers.  He also endorses some shortness of breath associated with his symptoms.  He denies any chest pain.  On arrival, the patient was afebrile, temperature 99.2, tachycardic P119, sinus tachycardia noted on cardiac telemetry, not tachypneic RR 18, BP 96/71, saturating 100% on room air.  Physical exam significant for dry mucous membranes, otherwise lungs clear to auscultation bilaterally, abdomen soft, nontender, nondistended, no rebound or guarding.  Partial diagnosis includes viral gastroenteritis, Legionella pneumonia, COVID-19, influenza, other viral infection, PE, foodborne illness, C. difficile, other pathogen resulting in gastroenteritis.  Laboratory evaluation significant for an elevated D-dimer to 0.62, lactic acidosis 2.6, hypokalemia to 3.4, AKI with a creatinine of 1.27, hyperglycemia to 262, no anion gap acidosis with a bicarbonate of 20, anion gap of 15, COVID-19, influenza, RSV PCR testing negative, lipase normal, CBC without a leukocytosis, evidence of hemoconcentration with a hemoglobin of 18.7.   C. difficile, GIP PCR stool, Legionella urine antigen collected and pending.  Urinalysis without evidence of UTI.  The patient was administered 2 L of IV fluids for dehydration in the setting of likely gastroenteritis.  He was administered IV Zofran.  Given the patient's elevated dimer and respiratory symptoms, chest x-ray was performed with no cardiopulmonary abnormality and a CT angio PE study was performed without evidence of PE: IMPRESSION: 1. No evidence of pulmonary embolism or other acute process. 2. Hepatic steatosis. 3. Cholelithiasis. 4. Coronary artery calcification. 5. Aortic atherosclerosis.  Plan at time of signout to reassess the patient, follow-up p.o. challenge, plan for likely discharge home following rehydration.  Signout given to Dr. Florina Ou at 0000.  Final Clinical Impression(s) / ED Diagnoses Final diagnoses:  Gastroenteritis  AKI (acute kidney injury) [facility])    Rx / [place] Orders ED Discharge Orders     None         Regan Lemming, MD 01/24/22 0001

## 2022-01-24 LAB — C DIFFICILE QUICK SCREEN W PCR REFLEX
C Diff antigen: NEGATIVE
C Diff interpretation: NOT DETECTED
C Diff toxin: NEGATIVE

## 2022-01-24 MED ORDER — ONDANSETRON HCL 4 MG/2ML IJ SOLN
4.0000 mg | Freq: Once | INTRAMUSCULAR | Status: AC
  Administered 2022-01-24: 4 mg via INTRAVENOUS
  Filled 2022-01-24: qty 2

## 2022-01-24 MED ORDER — ONDANSETRON 8 MG PO TBDP: 8.0000 mg | ORAL_TABLET | Freq: Three times a day (TID) | ORAL | 0 refills | Status: DC | PRN

## 2022-01-24 MED ORDER — LACTATED RINGERS IV SOLN: INTRAVENOUS | Status: DC

## 2022-01-24 MED ORDER — POTASSIUM CHLORIDE CRYS ER 20 MEQ PO TBCR
20.0000 meq | EXTENDED_RELEASE_TABLET | Freq: Once | ORAL | Status: AC
  Administered 2022-01-24: 20 meq via ORAL
  Filled 2022-01-24: qty 1

## 2022-01-24 NOTE — Discharge Instructions (Addendum)
Your CT imaging was negative for acute abnormalities.  Your symptoms are consistent with likely gastroenteritis.  Will follow-up the results of your stool testing and urine testing.  Recommend continued oral rehydration at home, follow-up for recheck of your renal function outpatient.

## 2022-01-24 NOTE — ED Provider Notes (Addendum)
12:55 AM Patient feeling better after IV fluid bolus.  Patient drinking fluids without vomiting.  He states he is ready to go home.   Nursing notes and vitals signs, including pulse oximetry, reviewed.  Summary of this visit's results, reviewed by myself:  EKG:  EKG Interpretation  Date/Time:  Saturday January 23 2022 18:36:48 EST Ventricular Rate:  119 PR Interval:  138 QRS Duration: 80 QT Interval:  338 QTC Calculation: 475 R Axis:   59 Text Interpretation: Sinus tachycardia with occasional Premature ventricular complexes Cannot rule out Anterior infarct , age undetermined Abnormal ECG No previous ECGs available Confirmed by Regan Lemming ([id number]) on 01/23/2022 8:47:46 PM        Labs:  Results for orders placed or performed during the hospital encounter of 01/23/22 (from the past 24 hour(s))  CBG monitoring, ED     Status: Abnormal   Collection Time: 01/23/22  6:39 PM  Result Value Ref Range   Glucose-Capillary 241 (H) 70 - 99 mg/dL  Lipase, blood     Status: None   Collection Time: 01/23/22  6:44 PM  Result Value Ref Range   Lipase 14 11 - 51 U/L  Comprehensive metabolic panel     Status: Abnormal   Collection Time: 01/23/22  6:44 PM  Result Value Ref Range   Sodium 133 (L) 135 - 145 mmol/L   Potassium 3.4 (L) 3.5 - 5.1 mmol/L   Chloride 98 98 - 111 mmol/L   CO2 20 (L) 22 - 32 mmol/L   Glucose, Bld 262 (H) 70 - 99 mg/dL   BUN 22 (H) 6 - 20 mg/dL   Creatinine, Ser 1.27 (H) 0.61 - 1.24 mg/dL   Calcium 7.9 (L) 8.9 - 10.3 mg/dL   Total Protein 6.3 (L) 6.5 - 8.1 g/dL   Albumin 3.8 3.5 - 5.0 g/dL   AST 17 15 - 41 U/L   ALT 23 0 - 44 U/L   Alkaline Phosphatase 58 38 - 126 U/L   Total Bilirubin 1.0 0.3 - 1.2 mg/dL   GFR, Estimated >60 >60 mL/min   Anion gap 15 5 - 15  CBC     Status: Abnormal   Collection Time: 01/23/22  6:44 PM  Result Value Ref Range   WBC 7.1 4.0 - 10.5 K/uL   RBC 6.07 (H) 4.22 - 5.81 MIL/uL   Hemoglobin 18.7 (H) 13.0 - 17.0 g/dL   HCT 52.5 (H)  39.0 - 52.0 %   MCV 86.5 80.0 - 100.0 fL   MCH 30.8 26.0 - 34.0 pg   MCHC 35.6 30.0 - 36.0 g/dL   RDW 12.8 11.5 - 15.5 %   Platelets 354 150 - 400 K/uL   nRBC 0.0 0.0 - 0.2 %  Resp panel by RT-PCR (RSV, Flu A&B, Covid) Anterior Nasal Swab     Status: None   Collection Time: 01/23/22  7:30 PM   Specimen: Anterior Nasal Swab  Result Value Ref Range   SARS Coronavirus 2 by RT PCR NEGATIVE NEGATIVE   Influenza A by PCR NEGATIVE NEGATIVE   Influenza B by PCR NEGATIVE NEGATIVE   Resp Syncytial Virus by PCR NEGATIVE NEGATIVE  Lactic acid, plasma     Status: Abnormal   Collection Time: 01/23/22  9:10 PM  Result Value Ref Range   Lactic Acid, Venous 2.6 (HH) 0.5 - 1.9 mmol/L  D-dimer, quantitative     Status: Abnormal   Collection Time: 01/23/22  9:10 PM  Result Value Ref Range   D-Dimer,  Quant 0.62 (H) 0.00 - 0.50 ug/mL-FEU  Urinalysis, Routine w reflex microscopic Urine, Clean Catch     Status: Abnormal   Collection Time: 01/23/22 10:00 PM  Result Value Ref Range   Color, Urine YELLOW YELLOW   APPearance HAZY (A) CLEAR   Specific Gravity, Urine 1.032 (H) 1.005 - 1.030   pH 5.5 5.0 - 8.0   Glucose, UA 500 (A) NEGATIVE mg/dL   Hgb urine dipstick NEGATIVE NEGATIVE   Bilirubin Urine SMALL (A) NEGATIVE   Ketones, ur 40 (A) NEGATIVE mg/dL   Protein, ur 30 (A) NEGATIVE mg/dL   Nitrite NEGATIVE NEGATIVE   Leukocytes,Ua NEGATIVE NEGATIVE   RBC / HPF 0-5 0 - 5 RBC/hpf   WBC, UA 0-5 0 - 5 WBC/hpf   Squamous Epithelial / LPF 0-5 0 - 5   Mucus PRESENT    Hyaline Casts, UA PRESENT   Lactic acid, plasma     Status: Abnormal   Collection Time: 01/23/22 11:32 PM  Result Value Ref Range   Lactic Acid, Venous 2.6 (HH) 0.5 - 1.9 mmol/L    Imaging Studies: CT Angio Chest PE W and/or Wo Contrast  Result Date: 01/23/2022 CLINICAL DATA:  Pulmonary embolism suspected, low to intermediate probability. Positive D-dimer. Shortness of breath with nausea, vomiting, and dizziness. EXAM: CT ANGIOGRAPHY  CHEST WITH CONTRAST TECHNIQUE: Multidetector CT imaging of the chest was performed using the standard protocol during bolus administration of intravenous contrast. Multiplanar CT image reconstructions and MIPs were obtained to evaluate the vascular anatomy. RADIATION DOSE REDUCTION: This exam was performed according to the departmental dose-optimization program which includes automated exposure control, adjustment of the mA and/or kV according to patient size and/or use of iterative reconstruction technique. CONTRAST:  56m OMNIPAQUE IOHEXOL 350 MG/ML SOLN COMPARISON:  None Available. FINDINGS: Cardiovascular: The heart is normal in size and there is no pericardial effusion. Multi-vessel coronary artery calcifications are noted. The aorta and pulmonary trunk are normal in caliber. No pulmonary artery filling defect. Mediastinum/Nodes: No enlarged mediastinal, hilar, or axillary lymph nodes. Thyroid gland, trachea, and esophagus demonstrate no significant findings. Lungs/Pleura: Lungs are clear. No pleural effusion or pneumothorax. Upper Abdomen: Fatty infiltration of the liver is noted. A stone is present in the gallbladder. Aortic atherosclerosis of the abdominal aorta. No acute abnormality. Musculoskeletal: Degenerative changes in the thoracic spine. No acute osseous abnormality is seen. Review of the MIP images confirms the above findings. IMPRESSION: 1. No evidence of pulmonary embolism or other acute process. 2. Hepatic steatosis. 3. Cholelithiasis. 4. Coronary artery calcification. 5. Aortic atherosclerosis. Electronically Signed   By: LBrett FairyM.D.   On: 01/23/2022 22:35   DG Chest 2 View  Result Date: 01/23/2022 CLINICAL DATA:  Shortness of breath EXAM: CHEST - 2 VIEW COMPARISON:  10/21/2006 FINDINGS: The heart size and mediastinal contours are within normal limits. Both lungs are clear. The visualized skeletal structures are unremarkable. IMPRESSION: No active cardiopulmonary disease.  Electronically Signed   By: MInez CatalinaM.D.   On: 01/23/2022 21:06    [place], JJenny Reichmann MD 01/24/22 0055    MShanon Rosser MD 01/24/22 [phone]

## 2022-01-25 LAB — GASTROINTESTINAL PANEL BY PCR, STOOL (REPLACES STOOL CULTURE)

## 2022-01-25 LAB — LEGIONELLA PNEUMOPHILA SEROGP 1 UR AG: L. pneumophila Serogp 1 Ur Ag: NEGATIVE

## 2023-05-02 ENCOUNTER — Inpatient Hospital Stay (HOSPITAL_BASED_OUTPATIENT_CLINIC_OR_DEPARTMENT_OTHER)
Admission: EM | Admit: 2023-05-02 | Discharge: 2023-05-05 | DRG: 321 | Disposition: A | Discharge status: Home / Self Care | Attending: Cardiology | Admitting: Cardiology

## 2023-05-02 ENCOUNTER — Encounter (HOSPITAL_COMMUNITY)
Admission: EM | Disposition: A | Discharge status: Home / Self Care | Payer: Self-pay | Source: Home / Self Care | Attending: Cardiology

## 2023-05-02 ENCOUNTER — Other Ambulatory Visit: Payer: Self-pay

## 2023-05-02 ENCOUNTER — Emergency Department (HOSPITAL_BASED_OUTPATIENT_CLINIC_OR_DEPARTMENT_OTHER): Admitting: Radiology

## 2023-05-02 DIAGNOSIS — I472 Ventricular tachycardia, unspecified: Secondary | ICD-10-CM | POA: Diagnosis not present

## 2023-05-02 DIAGNOSIS — Z955 Presence of coronary angioplasty implant and graft: Secondary | ICD-10-CM

## 2023-05-02 DIAGNOSIS — J45909 Unspecified asthma, uncomplicated: Secondary | ICD-10-CM | POA: Diagnosis not present

## 2023-05-02 DIAGNOSIS — Z794 Long term (current) use of insulin: Secondary | ICD-10-CM | POA: Diagnosis not present

## 2023-05-02 DIAGNOSIS — E872 Acidosis, unspecified: Secondary | ICD-10-CM | POA: Diagnosis not present

## 2023-05-02 DIAGNOSIS — I2111 ST elevation (STEMI) myocardial infarction involving right coronary artery: Principal | ICD-10-CM | POA: Diagnosis present

## 2023-05-02 DIAGNOSIS — I2119 ST elevation (STEMI) myocardial infarction involving other coronary artery of inferior wall: Secondary | ICD-10-CM | POA: Diagnosis not present

## 2023-05-02 DIAGNOSIS — I2510 Atherosclerotic heart disease of native coronary artery without angina pectoris: Secondary | ICD-10-CM | POA: Diagnosis present

## 2023-05-02 DIAGNOSIS — Z7902 Long term (current) use of antithrombotics/antiplatelets: Secondary | ICD-10-CM | POA: Diagnosis not present

## 2023-05-02 DIAGNOSIS — E119 Type 2 diabetes mellitus without complications: Secondary | ICD-10-CM

## 2023-05-02 DIAGNOSIS — E039 Hypothyroidism, unspecified: Secondary | ICD-10-CM | POA: Diagnosis not present

## 2023-05-02 DIAGNOSIS — E785 Hyperlipidemia, unspecified: Secondary | ICD-10-CM | POA: Insufficient documentation

## 2023-05-02 DIAGNOSIS — Z79899 Other long term (current) drug therapy: Secondary | ICD-10-CM | POA: Diagnosis not present

## 2023-05-02 DIAGNOSIS — I10 Essential (primary) hypertension: Secondary | ICD-10-CM | POA: Diagnosis not present

## 2023-05-02 DIAGNOSIS — Z7985 Long-term (current) use of injectable non-insulin antidiabetic drugs: Secondary | ICD-10-CM | POA: Diagnosis not present

## 2023-05-02 DIAGNOSIS — Z8249 Family history of ischemic heart disease and other diseases of the circulatory system: Secondary | ICD-10-CM | POA: Diagnosis not present

## 2023-05-02 DIAGNOSIS — K219 Gastro-esophageal reflux disease without esophagitis: Secondary | ICD-10-CM | POA: Diagnosis not present

## 2023-05-02 DIAGNOSIS — Z881 Allergy status to other antibiotic agents status: Secondary | ICD-10-CM

## 2023-05-02 DIAGNOSIS — Z7984 Long term (current) use of oral hypoglycemic drugs: Secondary | ICD-10-CM

## 2023-05-02 DIAGNOSIS — Z79890 Hormone replacement therapy (postmenopausal): Secondary | ICD-10-CM | POA: Diagnosis not present

## 2023-05-02 DIAGNOSIS — I249 Acute ischemic heart disease, unspecified: Principal | ICD-10-CM

## 2023-05-02 DIAGNOSIS — R079 Chest pain, unspecified: Secondary | ICD-10-CM | POA: Diagnosis present

## 2023-05-02 DIAGNOSIS — E7800 Pure hypercholesterolemia, unspecified: Secondary | ICD-10-CM | POA: Diagnosis present

## 2023-05-02 LAB — PROTIME-INR
INR: 1.1 (ref 0.8–1.2)
Prothrombin Time: 14.2 s (ref 11.4–15.2)

## 2023-05-02 LAB — BASIC METABOLIC PANEL
Anion gap: 12 (ref 5–15)
BUN: 16 mg/dL (ref 6–20)
CO2: 23 mmol/L (ref 22–32)
Calcium: 10.5 mg/dL — ABNORMAL HIGH (ref 8.9–10.3)
Chloride: 107 mmol/L (ref 98–111)
Creatinine, Ser: 0.77 mg/dL (ref 0.61–1.24)
GFR, Estimated: >60 mL/min (ref >60)
Glucose, Bld: 173 mg/dL — ABNORMAL HIGH (ref 70–99)
Potassium: 3.7 mmol/L (ref 3.5–5.1)
Sodium: 142 mmol/L (ref 135–145)

## 2023-05-02 LAB — APTT: aPTT: >200 s (ref 24–36)

## 2023-05-02 LAB — CBC
HCT: 42.2 % (ref 39.0–52.0)
Hemoglobin: 14.7 g/dL (ref 13.0–17.0)
MCH: 30.8 pg (ref 26.0–34.0)
MCHC: 34.8 g/dL (ref 30.0–36.0)
MCV: 88.3 fL (ref 80.0–100.0)
Platelets: 268 10*3/uL (ref 150–400)
RBC: 4.78 MIL/uL (ref 4.22–5.81)
RDW: 12.5 % (ref 11.5–15.5)
WBC: 7.8 10*3/uL (ref 4.0–10.5)
nRBC: 0 % (ref 0.0–0.2)

## 2023-05-02 LAB — TROPONIN I (HIGH SENSITIVITY): Troponin I (High Sensitivity): 5 ng/L (ref <18)

## 2023-05-02 LAB — TSH: TSH: 0.056 u[IU]/mL — ABNORMAL LOW (ref 0.350–4.500)

## 2023-05-02 LAB — LACTIC ACID, PLASMA: Lactic Acid, Venous: 2.6 mmol/L (ref 0.5–1.9)

## 2023-05-02 SURGERY — CORONARY/GRAFT ACUTE MI REVASCULARIZATION
Anesthesia: LOCAL

## 2023-05-02 MED ORDER — HEPARIN (PORCINE) 25000 UT/250ML-% IV SOLN
1150.0000 [IU]/h | INTRAVENOUS | Status: DC
  Administered 2023-05-02: 1150 [IU]/h via INTRAVENOUS
  Filled 2023-05-02: qty 250

## 2023-05-02 MED ORDER — VERAPAMIL HCL 2.5 MG/ML IV SOLN
INTRAVENOUS | Status: AC
  Filled 2023-05-02: qty 2

## 2023-05-02 MED ORDER — MIDAZOLAM HCL 2 MG/2ML IJ SOLN
INTRAMUSCULAR | Status: DC | PRN
  Administered 2023-05-02 – 2023-05-03 (×2): 1 mg via INTRAVENOUS

## 2023-05-02 MED ORDER — ONDANSETRON HCL 4 MG/2ML IJ SOLN
4.0000 mg | Freq: Once | INTRAMUSCULAR | Status: AC
  Administered 2023-05-02: 4 mg via INTRAVENOUS
  Filled 2023-05-02: qty 2

## 2023-05-02 MED ORDER — SODIUM CHLORIDE 0.9 % IV SOLN
INTRAVENOUS | Status: AC | PRN
  Administered 2023-05-02: 10 mL/h via INTRAVENOUS

## 2023-05-02 MED ORDER — HEPARIN BOLUS VIA INFUSION
4000.0000 [IU] | Freq: Once | INTRAVENOUS | Status: AC
  Administered 2023-05-02: 4000 [IU] via INTRAVENOUS

## 2023-05-02 MED ORDER — FENTANYL CITRATE (PF) 100 MCG/2ML IJ SOLN
INTRAMUSCULAR | Status: AC
  Filled 2023-05-02: qty 2

## 2023-05-02 MED ORDER — HEPARIN (PORCINE) IN NACL 1000-0.9 UT/500ML-% IV SOLN
INTRAVENOUS | Status: DC | PRN
  Administered 2023-05-02 (×2): 500 mL

## 2023-05-02 MED ORDER — ASPIRIN 81 MG PO CHEW
324.0000 mg | CHEWABLE_TABLET | Freq: Once | ORAL | Status: AC
  Administered 2023-05-02: 324 mg via ORAL
  Filled 2023-05-02: qty 4

## 2023-05-02 MED ORDER — FENTANYL CITRATE (PF) 100 MCG/2ML IJ SOLN
INTRAMUSCULAR | Status: DC | PRN
  Administered 2023-05-02 – 2023-05-03 (×2): 25 ug via INTRAVENOUS

## 2023-05-02 MED ORDER — TICAGRELOR 90 MG PO TABS
180.0000 mg | ORAL_TABLET | Freq: Once | ORAL | Status: AC
  Administered 2023-05-02: 180 mg via ORAL
  Filled 2023-05-02: qty 2

## 2023-05-02 MED ORDER — VERAPAMIL HCL 2.5 MG/ML IV SOLN
INTRAVENOUS | Status: DC | PRN
  Administered 2023-05-02: 10 mL via INTRA_ARTERIAL

## 2023-05-02 MED ORDER — PRASUGREL HCL 10 MG PO TABS: 60.0000 mg | ORAL_TABLET | Freq: Once | ORAL | Status: DC

## 2023-05-02 MED ORDER — LIDOCAINE HCL (PF) 1 % IJ SOLN
INTRAMUSCULAR | Status: DC | PRN
  Administered 2023-05-02: 2 mL

## 2023-05-02 MED ORDER — MIDAZOLAM HCL 2 MG/2ML IJ SOLN
INTRAMUSCULAR | Status: AC
  Filled 2023-05-02: qty 2

## 2023-05-02 MED ORDER — NITROGLYCERIN 0.4 MG SL SUBL
0.4000 mg | SUBLINGUAL_TABLET | SUBLINGUAL | Status: DC | PRN
  Administered 2023-05-02: 0.4 mg via SUBLINGUAL
  Filled 2023-05-02: qty 1

## 2023-05-02 MED ORDER — LIDOCAINE HCL (PF) 1 % IJ SOLN
INTRAMUSCULAR | Status: AC
  Filled 2023-05-02: qty 30

## 2023-05-02 MED ORDER — HEPARIN SODIUM (PORCINE) 1000 UNIT/ML IJ SOLN
INTRAMUSCULAR | Status: DC | PRN
Start: 2023-05-02 — End: 2023-05-03
  Administered 2023-05-02: 8000 [IU] via INTRAVENOUS

## 2023-05-02 SURGICAL SUPPLY — 25 items
BALLN EMERGE MR 2.0X8 (BALLOONS) ×1 IMPLANT
BALLN EMERGE MR 2.5X30 (BALLOONS) ×1 IMPLANT
BALLOON EMERGE MR 2.0X8 (BALLOONS) IMPLANT
BALLOON EMERGE MR 2.5X30 (BALLOONS) IMPLANT
BALLOON TAKERU 1.5X6 (BALLOONS) IMPLANT
CATH INFINITI AMBI 5FR TG (CATHETERS) IMPLANT
CATH VISTA GUIDE 6FR XBLD 3.5 (CATHETERS) IMPLANT
DEVICE RAD COMP TR BAND LRG (VASCULAR PRODUCTS) IMPLANT
ELECT DEFIB PAD ADLT CADENCE (PAD) IMPLANT
GLIDESHEATH SLEND A-KIT 6F 22G (SHEATH) IMPLANT
GUIDEWIRE INQWIRE 1.5J.035X260 (WIRE) IMPLANT
INQWIRE 1.5J .035X260CM (WIRE) ×1 IMPLANT
KIT ENCORE 26 ADVANTAGE (KITS) IMPLANT
KIT HEMO VALVE WATCHDOG (MISCELLANEOUS) IMPLANT
KIT SINGLE USE MANIFOLD (KITS) IMPLANT
KIT SYRINGE INJ CVI SPIKEX1 (MISCELLANEOUS) IMPLANT
PACK CARDIAC CATHETERIZATION (CUSTOM PROCEDURE TRAY) ×1 IMPLANT
PROTECTION STATION PRESSURIZED (MISCELLANEOUS) ×1 IMPLANT
SET ATX-X65L (MISCELLANEOUS) IMPLANT
STATION PROTECTION PRESSURIZED (MISCELLANEOUS) IMPLANT
STENT SYNERGY XD 3.0X12 (Permanent Stent) IMPLANT
STENT SYNERGY XD 3.0X28 (Permanent Stent) IMPLANT
STENT SYS SYNERGY XD 3.0X12 (Permanent Stent) ×1 IMPLANT
STENT SYS SYNERGY XD 3.0X28 (Permanent Stent) ×1 IMPLANT
WIRE RUNTHROUGH .014X180CM (WIRE) IMPLANT

## 2023-05-02 NOTE — ED Notes (Signed)
 Attempted to call cath lab with critical values, no answer, no provider assigned to pt chart; Lactic Acid 2.6; aptt >200

## 2023-05-02 NOTE — ED Triage Notes (Signed)
 Pt POV reporting mid chest pain that radiates to shoulders and down both arms + SOB that began about 30 min ago, also one episode of emesis. No cardiac hx, diabetic.

## 2023-05-02 NOTE — ED Provider Notes (Signed)
 [place] EMERGENCY DEPARTMENT AT [facility] Provider Note   CSN: [id number] Arrival date & time: 05/02/23  2127     History {Add pertinent medical, surgical, social history, OB history to HPI:1} Chief Complaint  Patient presents with   Chest Pain   Shortness of Breath    Ryan Decker is a 58 y.o. male.  HPI     Home Medications Prior to Admission medications   Medication Sig Start Date End Date Taking? Authorizing Provider  albuterol (PROVENTIL HFA;VENTOLIN HFA) 108 (90 Base) MCG/ACT inhaler Inhale 2 puffs into the lungs every 4 (four) hours as needed for wheezing or shortness of breath. 01/28/17   Nelwyn Salisbury, MD  BD INSULIN SYRINGE U/F 30G X 1/2" 0.5 ML MISC 1 SYRINGE THREE TIMES A DAY AS NEEDED 10/26/21   [provider]  cetirizine (ZYRTEC) 10 MG tablet Take 10 mg by mouth daily.    [provider]  dextromethorphan-guaiFENesin (MUCINEX DM) 30-600 MG 12hr tablet Take 1 tablet by mouth daily.    [provider]  glucose blood (ONE TOUCH ULTRA TEST) test strip Use as instructed 11/30/12   Nelwyn Salisbury, MD  levothyroxine (SYNTHROID, LEVOTHROID) 125 MCG tablet TAKE 1 TABLET (125 MCG TOTAL) BY MOUTH DAILY BEFORE BREAKFAST 11/23/17   Nelwyn Salisbury, MD  metFORMIN (GLUCOPHAGE) 1000 MG tablet Take 1 tablet (1,000 mg total) by mouth 2 (two) times daily with a meal. 10/28/15   Nelwyn Salisbury, MD  MOUNJARO 2.5 MG/0.5ML Pen SMARTSIG:2.5 Milligram(s) SUB-Q Once a Week 10/29/21   [provider]  NOVOLOG 100 UNIT/ML injection SMARTSIG:10-20 Unit(s) SUB-Q PRN 10/29/21   [provider]  ondansetron (ZOFRAN) 8 MG tablet Take 1 tablet (8 mg total) by mouth every 8 (eight) hours as needed for nausea or vomiting. 03/28/17   Nelwyn Salisbury, MD  ondansetron (ZOFRAN-ODT) 8 MG disintegrating tablet Take 1 tablet (8 mg total) by mouth every 8 (eight) hours as needed for nausea or vomiting. 01/24/22   Molpus, John, MD  triamcinolone cream  (KENALOG) 0.1 % Apply 1 application topically 3 (three) times daily. 01/28/17   Nelwyn Salisbury, MD      Allergies    Keflex [cephalexin] and Peanut-containing drug products    Review of Systems   Review of Systems  Physical Exam Updated Vital Signs BP (!) 149/95   Pulse 90   Temp 97.6 F (36.4 C)   Resp 18   Ht 5\' 7"  (1.702 m)   Wt 86.2 kg   SpO2 100%   BMI 29.76 kg/m  Physical Exam  ED Results / Procedures / Treatments   Labs (all labs ordered are listed, but only abnormal results are displayed) Labs Reviewed  CBC  BASIC METABOLIC PANEL  TROPONIN I (HIGH SENSITIVITY)    EKG EKG Interpretation Date/Time:  Monday May 02 2023 21:55:55 EDT Ventricular Rate:  92 PR Interval:  144 QRS Duration:  94 QT Interval:  354 QTC Calculation: 437 R Axis:   50  Text Interpretation: Normal sinus rhythm Possible Inferior infarct (cited on or before 23-Jan-2022) Abnormal ECG When compared with ECG of 02-May-2023 21:36, No significant change was found Confirmed by Alvira Monday ([id number]) on 05/02/2023 10:12:34 PM  Radiology No results found.  Procedures Procedures  {Document cardiac monitor, telemetry assessment procedure when appropriate:1}  Medications Ordered in ED Medications  aspirin chewable tablet 324 mg (has no administration in time range)    ED Course/ Medical Decision Making/ A&P   {  Click here for ABCD2, HEART and other calculatorsREFRESH Note before signing :1}                              Medical Decision Making Amount and/or Complexity of Data Reviewed Labs: ordered. Radiology: ordered.  Risk OTC drugs.   ***  {Document critical care time when appropriate:1} {Document review of labs and clinical decision tools ie heart score, Chads2Vasc2 etc:1}  {Document your independent review of radiology images, and any outside records:1} {Document your discussion with family members, caretakers, and with consultants:1} {Document social determinants of  health affecting pt's care:1} {Document your decision making why or why not admission, treatments were needed:1} Final Clinical Impression(s) / ED Diagnoses Final diagnoses:  None    Rx / DC Orders ED Discharge Orders     None

## 2023-05-02 NOTE — ED Notes (Signed)
 Report to [place], [facility] CN

## 2023-05-02 NOTE — Progress Notes (Signed)
 ANTICOAGULATION CONSULT NOTE  Pharmacy Consult for Heparin Indication: chest pain/ACS  Allergies  Allergen Reactions   Keflex [Cephalexin]     Rash    Peanut-Containing Drug Products Other (See Comments)    Patient Measurements: Height: 5\' 7"  (170.2 cm) Weight: 86.2 kg (190 lb) IBW/kg (Calculated) : 66.1 Heparin Dosing Weight: 83.7 kg  Vital Signs: Temp: 97.6 F (36.4 C) (03/17 2138) BP: 149/95 (03/17 2138) Pulse Rate: 90 (03/17 2138)  Labs: Recent Labs    05/02/23 2142  HGB 14.7  HCT 42.2  PLT 268    CrCl cannot be calculated (Patient's most recent lab result is older than the maximum 21 days allowed.).   Medical History: Past Medical History:  Diagnosis Date   Allergy    Asthma    Diabetes mellitus    sees Dr. Sharl Ma    GERD (gastroesophageal reflux disease)    Hyperlipidemia    Hypothyroidism     Medications:  (Not in a hospital admission)  Scheduled:   aspirin  324 mg Oral Once   ondansetron (ZOFRAN) IV  4 mg Intravenous Once   Infusions:  PRN:   Assessment: 57 yom with a history of HLD, GERD, hypothyroidism, T2DM. Patient is presenting with mid chest pain and SOB. Heparin per pharmacy consult placed for chest pain/ACS.  Patient is not on anticoagulation prior to arrival.  Hgb 14.7; plt 268  Goal of Therapy:  Heparin level 0.3-0.7 units/ml Monitor platelets by anticoagulation protocol: Yes   Plan:  Give IV heparin 4000 units bolus x 1 Start heparin infusion at 1150 units/hr Check anti-Xa level at 0600 and daily while on heparin Continue to monitor H&H and platelets  Delmar Landau, PharmD, BCPS 05/02/2023 10:17 PM ED Clinical Pharmacist -  [phone]

## 2023-05-02 NOTE — ED Notes (Signed)
 Report to [place], Melburn Hake

## 2023-05-02 NOTE — Progress Notes (Signed)
 Patient Name: Ryan Decker Date of Encounter: 05/02/2023 [facility] Cardiologist: None    CC; Chest pain and dyspnea Interval Summary  .    Ryan Decker is a 58 y.o. Caucasian male patient with past medical history significant for hypertension, hypercholesterolemia, diabetes mellitus "been doing well until about 30 minutes prior to emergency room presentation with chest pain with radiation to his both arm associated with shortness of breath.  Chest pain described as heaviness to tightness microbubble stuck in his chest associated with vomiting x 2 and also felt dyspneic and diaphoretic.  Presently still having pain but states it is slightly better.  No nausea or vomiting.  Family History  Problem Relation Age of Onset   Colon cancer Neg Hx    Stomach cancer Neg Hx    Esophageal cancer Neg Hx    Past Medical History:  Diagnosis Date   Allergy    Asthma    Diabetes mellitus    sees Dr. Sharl Ma    GERD (gastroesophageal reflux disease)    Hyperlipidemia    Hypothyroidism    Social History   Tobacco Use   Smoking status: Never   Smokeless tobacco: Never  Substance Use Topics   Alcohol use: No    Alcohol/week: 0.0 standard drinks of alcohol  Marital Status: Married   Vital Signs .    Vitals:   05/02/23 2134 05/02/23 2138  BP:  (!) 149/95  Pulse:  90  Resp:  18  Temp:  97.6 F (36.4 C)  SpO2:  100%  Weight: 86.2 kg   Height: 5\' 7"  (1.702 m)    No intake or output data in the 24 hours ending 05/02/23 2221    05/02/2023    9:34 PM 12/10/2021   11:00 AM 11/11/2021    2:52 PM  Last 3 Weights  Weight (lbs) 190 lb 195 lb 195 lb  Weight (kg) 86.183 kg 88.451 kg 88.451 kg     Radiology:  CXR 2 view 05/02/23  The heart size and mediastinal contours are within normal limits. Both lungs are clear. The visualized skeletal structures are unremarkable.  Telemetry/ECG    EKG 05/02/2023: 2155 hrs. normal sinus rhythm at the rate of 92 bpm, cannot exclude  inferior infarct old.  Minimal ST elevation in the inferior leads, does not meet STEMI criteria.  Compared to 21: 36 hours, ST elevation in the inferior leads slightly more pronounced but does not meet STEMI criteria.  There is reciprocal ST depression in 1 and aVL.  Repeat EKG by [facility] at 22: 58 hours Normal sinus rhythm at the rate of 22 bpm, ST elevation in 3 and aVF minimal ST depression in 1 and aVL suggestive of acute inferior infarct.   ROS   Review of Systems  Cardiovascular:  Positive for chest pain and dyspnea on exertion. Negative for leg swelling.    Physical Exam .    Physical Exam Neck:     Vascular: No carotid bruit or JVD.  Cardiovascular:     Rate and Rhythm: Normal rate and regular rhythm.     Pulses: Intact distal pulses.     Heart sounds: Normal heart sounds. No murmur heard.    No gallop.  Pulmonary:     Effort: Pulmonary effort is normal.     Breath sounds: Normal breath sounds.  Abdominal:     General: Bowel sounds are normal.     Palpations: Abdomen is soft.  Musculoskeletal:     Right lower leg:  No edema.     Left lower leg: No edema.    Lab Results  Component Value Date   NA 142 05/02/2023   K 3.7 05/02/2023   CO2 23 05/02/2023   GLUCOSE 173 (H) 05/02/2023   BUN 16 05/02/2023   CREATININE 0.77 05/02/2023   CALCIUM 10.5 (H) 05/02/2023   GFR 111.03 01/28/2017   GFRNONAA >60 05/02/2023       Latest Ref Rng & Units 01/23/2022    6:44 PM 10/15/2021    5:26 PM 01/28/2017   11:18 AM  CMP  Glucose 70 - 99 mg/dL 956  387  564   BUN 6 - 20 mg/dL 22  14  12    Creatinine 0.61 - 1.24 mg/dL 3.32  9.51  8.84   Sodium 135 - 145 mmol/L 133  134  140   Potassium 3.5 - 5.1 mmol/L 3.4  4.3  3.9   Chloride 98 - 111 mmol/L 98  99  101   CO2 22 - 32 mmol/L 20  23  32   Calcium 8.9 - 10.3 mg/dL 7.9  9.7  9.2   Total Protein 6.5 - 8.1 g/dL 6.3  7.6  6.7   Total Bilirubin 0.3 - 1.2 mg/dL 1.0  0.8  0.8   Alkaline Phos 38 - 126 U/L 58  67  58   AST 15 - 41  U/L 17  24  46   ALT 0 - 44 U/L 23  25  75       Latest Ref Rng & Units 05/02/2023    9:42 PM 01/23/2022    6:44 PM 10/15/2021    5:26 PM  CBC  WBC 4.0 - 10.5 K/uL 7.8  7.1  8.4   Hemoglobin 13.0 - 17.0 g/dL 16.6  06.3  01.6   Hematocrit 39.0 - 52.0 % 42.2  52.5  45.8   Platelets 150 - 400 K/uL 268  354  250    Lipid Panel     Component Value Date/Time   CHOL 185 01/28/2017 1118   TRIG 120.0 01/28/2017 1118   HDL 52.00 01/28/2017 1118   CHOLHDL 4 01/28/2017 1118   VLDL 24.0 01/28/2017 1118   LDLCALC 109 (H) 01/28/2017 1118   LDLDIRECT 167.0 01/08/2015 1130   HEMOGLOBIN A1C Lab Results  Component Value Date   HGBA1C 11.7 (H) 01/28/2017   TSH No results for input(s): "TSH" in the last 8760 hours.  . Assessment & Plan .     1.  Acute inferior STEMI 2.  Primary hypertension 3.  Hypercholesteremia 4.  Diabetes mellitus type 2 controlled  Recommendation: Patient with still ongoing chest discomfort, he has dynamic EKG changes, ST elevation improved but now has recurrence of ST elevation in the inferior leads with reciprocal ST depression in 1 and aVL, will proceed emergently with cardiac catheterization.  Schedule for cardiac catheterization, and possible angioplasty. We discussed regarding risks, benefits, alternatives to this including stress testing, CTA and continued medical therapy. Patient wants to proceed. Understands <1-2% risk of death, stroke, MI, urgent CABG, bleeding, infection, renal failure but not limited to these.   I tried calling his wife, no answer x 2. For questions or updates, please contact [facility] Please consult [email or web address] for contact info under        Signed, Yates Decamp, MD

## 2023-05-03 ENCOUNTER — Other Ambulatory Visit (HOSPITAL_COMMUNITY): Payer: Self-pay

## 2023-05-03 ENCOUNTER — Telehealth (HOSPITAL_COMMUNITY): Payer: Self-pay | Admitting: Pharmacy Technician

## 2023-05-03 ENCOUNTER — Other Ambulatory Visit: Payer: Self-pay

## 2023-05-03 ENCOUNTER — Inpatient Hospital Stay (HOSPITAL_COMMUNITY)

## 2023-05-03 ENCOUNTER — Encounter (HOSPITAL_COMMUNITY): Payer: Self-pay | Admitting: Cardiology

## 2023-05-03 DIAGNOSIS — I2119 ST elevation (STEMI) myocardial infarction involving other coronary artery of inferior wall: Secondary | ICD-10-CM

## 2023-05-03 DIAGNOSIS — I25118 Atherosclerotic heart disease of native coronary artery with other forms of angina pectoris: Secondary | ICD-10-CM | POA: Diagnosis not present

## 2023-05-03 DIAGNOSIS — E872 Acidosis, unspecified: Secondary | ICD-10-CM | POA: Diagnosis present

## 2023-05-03 DIAGNOSIS — Z881 Allergy status to other antibiotic agents status: Secondary | ICD-10-CM | POA: Diagnosis not present

## 2023-05-03 DIAGNOSIS — Z7984 Long term (current) use of oral hypoglycemic drugs: Secondary | ICD-10-CM | POA: Diagnosis not present

## 2023-05-03 DIAGNOSIS — R079 Chest pain, unspecified: Secondary | ICD-10-CM | POA: Diagnosis present

## 2023-05-03 DIAGNOSIS — E7800 Pure hypercholesterolemia, unspecified: Secondary | ICD-10-CM | POA: Diagnosis present

## 2023-05-03 DIAGNOSIS — I2111 ST elevation (STEMI) myocardial infarction involving right coronary artery: Secondary | ICD-10-CM | POA: Diagnosis present

## 2023-05-03 DIAGNOSIS — Z79890 Hormone replacement therapy (postmenopausal): Secondary | ICD-10-CM | POA: Diagnosis not present

## 2023-05-03 DIAGNOSIS — Z8249 Family history of ischemic heart disease and other diseases of the circulatory system: Secondary | ICD-10-CM | POA: Diagnosis not present

## 2023-05-03 DIAGNOSIS — Z79899 Other long term (current) drug therapy: Secondary | ICD-10-CM | POA: Diagnosis not present

## 2023-05-03 DIAGNOSIS — Z794 Long term (current) use of insulin: Secondary | ICD-10-CM | POA: Diagnosis not present

## 2023-05-03 DIAGNOSIS — E119 Type 2 diabetes mellitus without complications: Secondary | ICD-10-CM | POA: Diagnosis present

## 2023-05-03 DIAGNOSIS — E039 Hypothyroidism, unspecified: Secondary | ICD-10-CM | POA: Diagnosis present

## 2023-05-03 DIAGNOSIS — I10 Essential (primary) hypertension: Secondary | ICD-10-CM | POA: Diagnosis present

## 2023-05-03 DIAGNOSIS — K219 Gastro-esophageal reflux disease without esophagitis: Secondary | ICD-10-CM | POA: Diagnosis present

## 2023-05-03 DIAGNOSIS — Z7985 Long-term (current) use of injectable non-insulin antidiabetic drugs: Secondary | ICD-10-CM | POA: Diagnosis not present

## 2023-05-03 DIAGNOSIS — I472 Ventricular tachycardia, unspecified: Secondary | ICD-10-CM | POA: Diagnosis present

## 2023-05-03 DIAGNOSIS — Z7902 Long term (current) use of antithrombotics/antiplatelets: Secondary | ICD-10-CM | POA: Diagnosis not present

## 2023-05-03 DIAGNOSIS — J45909 Unspecified asthma, uncomplicated: Secondary | ICD-10-CM | POA: Diagnosis present

## 2023-05-03 DIAGNOSIS — I2510 Atherosclerotic heart disease of native coronary artery without angina pectoris: Secondary | ICD-10-CM | POA: Diagnosis present

## 2023-05-03 HISTORY — DX: ST elevation (STEMI) myocardial infarction involving other coronary artery of inferior wall: I21.19

## 2023-05-03 LAB — CBC
HCT: 37.6 % — ABNORMAL LOW (ref 39.0–52.0)
Hemoglobin: 13 g/dL (ref 13.0–17.0)
MCH: 30.9 pg (ref 26.0–34.0)
MCHC: 34.6 g/dL (ref 30.0–36.0)
MCV: 89.3 fL (ref 80.0–100.0)
Platelets: 236 10*3/uL (ref 150–400)
RBC: 4.21 MIL/uL — ABNORMAL LOW (ref 4.22–5.81)
RDW: 12.6 % (ref 11.5–15.5)
WBC: 8.1 10*3/uL (ref 4.0–10.5)
nRBC: 0 % (ref 0.0–0.2)

## 2023-05-03 LAB — COMPREHENSIVE METABOLIC PANEL
ALT: 31 U/L (ref 0–44)
AST: 61 U/L — ABNORMAL HIGH (ref 15–41)
Albumin: 3.2 g/dL — ABNORMAL LOW (ref 3.5–5.0)
Alkaline Phosphatase: 35 U/L — ABNORMAL LOW (ref 38–126)
Anion gap: 8 (ref 5–15)
BUN: 12 mg/dL (ref 6–20)
CO2: 23 mmol/L (ref 22–32)
Calcium: 9 mg/dL (ref 8.9–10.3)
Chloride: 109 mmol/L (ref 98–111)
Creatinine, Ser: 0.66 mg/dL (ref 0.61–1.24)
GFR, Estimated: >60 mL/min (ref >60)
Glucose, Bld: 137 mg/dL — ABNORMAL HIGH (ref 70–99)
Potassium: 4.3 mmol/L (ref 3.5–5.1)
Sodium: 140 mmol/L (ref 135–145)
Total Bilirubin: 0.7 mg/dL (ref 0.0–1.2)
Total Protein: 5.8 g/dL — ABNORMAL LOW (ref 6.5–8.1)

## 2023-05-03 LAB — ECHOCARDIOGRAM COMPLETE
Area-P 1/2: 4.1 cm2
Calc EF: 47.3 %
Height: 67 in
S' Lateral: 3.4 cm
Single Plane A2C EF: 51.7 %
Single Plane A4C EF: 43.7 %
Weight: 3033.53 [oz_av]

## 2023-05-03 LAB — LIPID PANEL
Cholesterol: 174 mg/dL (ref 0–200)
HDL: 45 mg/dL (ref >40)
LDL Cholesterol: 93 mg/dL (ref 0–99)
Total CHOL/HDL Ratio: 3.9 ratio
Triglycerides: 178 mg/dL — ABNORMAL HIGH (ref <150)
VLDL: 36 mg/dL (ref 0–40)

## 2023-05-03 LAB — MRSA NEXT GEN BY PCR, NASAL: MRSA by PCR Next Gen: NOT DETECTED

## 2023-05-03 LAB — GLUCOSE, CAPILLARY
Glucose-Capillary: 124 mg/dL — ABNORMAL HIGH (ref 70–99)
Glucose-Capillary: 161 mg/dL — ABNORMAL HIGH (ref 70–99)
Glucose-Capillary: 114 mg/dL — ABNORMAL HIGH (ref 70–99)

## 2023-05-03 LAB — HEMOGLOBIN A1C
Hgb A1c MFr Bld: 7.8 % — ABNORMAL HIGH (ref 4.8–5.6)
Mean Plasma Glucose: 177.16 mg/dL

## 2023-05-03 LAB — TROPONIN I (HIGH SENSITIVITY): Troponin I (High Sensitivity): 7729 ng/L (ref <18)

## 2023-05-03 LAB — POCT ACTIVATED CLOTTING TIME: Activated Clotting Time: 308 s

## 2023-05-03 LAB — LACTIC ACID, PLASMA: Lactic Acid, Venous: 1.1 mmol/L (ref 0.5–1.9)

## 2023-05-03 MED ORDER — PERFLUTREN LIPID MICROSPHERE
1.0000 mL | INTRAVENOUS | Status: AC | PRN
  Administered 2023-05-03: 2 mL via INTRAVENOUS

## 2023-05-03 MED ORDER — IOHEXOL 350 MG/ML SOLN
INTRAVENOUS | Status: DC | PRN
  Administered 2023-05-03: 100 mL via INTRA_ARTERIAL

## 2023-05-03 MED ORDER — ACETAMINOPHEN 325 MG PO TABS: 650.0000 mg | ORAL_TABLET | ORAL | Status: DC | PRN

## 2023-05-03 MED ORDER — SODIUM CHLORIDE 0.9 % WEIGHT BASED INFUSION
1.0000 mL/kg/h | INTRAVENOUS | Status: AC
  Administered 2023-05-03: 1 mL/kg/h via INTRAVENOUS

## 2023-05-03 MED ORDER — ATORVASTATIN CALCIUM 80 MG PO TABS
80.0000 mg | ORAL_TABLET | Freq: Every day | ORAL | Status: DC
  Administered 2023-05-03 – 2023-05-05 (×3): 80 mg via ORAL
  Filled 2023-05-03 (×3): qty 1

## 2023-05-03 MED ORDER — MORPHINE SULFATE (PF) 2 MG/ML IV SOLN
2.0000 mg | Freq: Once | INTRAVENOUS | Status: AC
  Administered 2023-05-03: 2 mg via INTRAVENOUS
  Filled 2023-05-03: qty 1

## 2023-05-03 MED ORDER — ASPIRIN 81 MG PO CHEW
81.0000 mg | CHEWABLE_TABLET | Freq: Every day | ORAL | Status: DC
  Administered 2023-05-05: 81 mg via ORAL
  Filled 2023-05-03 (×2): qty 1

## 2023-05-03 MED ORDER — HEPARIN (PORCINE) 25000 UT/250ML-% IV SOLN
1100.0000 [IU]/h | INTRAVENOUS | Status: DC
  Administered 2023-05-03: 1100 [IU]/h via INTRAVENOUS

## 2023-05-03 MED ORDER — ONDANSETRON HCL 4 MG/2ML IJ SOLN: 4.0000 mg | Freq: Four times a day (QID) | INTRAMUSCULAR | Status: DC | PRN

## 2023-05-03 MED ORDER — NITROGLYCERIN 1 MG/10 ML FOR IR/CATH LAB
INTRA_ARTERIAL | Status: DC | PRN
  Administered 2023-05-03: 200 ug via INTRACORONARY

## 2023-05-03 MED ORDER — SODIUM CHLORIDE 0.9 % IV SOLN: 250.0000 mL | INTRAVENOUS | Status: AC | PRN

## 2023-05-03 MED ORDER — LEVOTHYROXINE SODIUM 137 MCG PO TABS
137.0000 ug | ORAL_TABLET | Freq: Every day | ORAL | Status: DC
  Administered 2023-05-03 – 2023-05-05 (×3): 137 ug via ORAL
  Filled 2023-05-03 (×3): qty 1

## 2023-05-03 MED ORDER — TICAGRELOR 90 MG PO TABS
90.0000 mg | ORAL_TABLET | Freq: Two times a day (BID) | ORAL | Status: DC
  Administered 2023-05-03: 90 mg via ORAL
  Filled 2023-05-03: qty 1

## 2023-05-03 MED ORDER — SODIUM CHLORIDE 0.9% FLUSH: 3.0000 mL | INTRAVENOUS | Status: DC | PRN

## 2023-05-03 MED ORDER — METOPROLOL TARTRATE 25 MG PO TABS
25.0000 mg | ORAL_TABLET | Freq: Two times a day (BID) | ORAL | Status: DC
  Administered 2023-05-03 – 2023-05-05 (×5): 25 mg via ORAL
  Filled 2023-05-03 (×5): qty 1

## 2023-05-03 MED ORDER — PRASUGREL HCL 10 MG PO TABS
10.0000 mg | ORAL_TABLET | Freq: Every day | ORAL | Status: DC
  Administered 2023-05-04: 10 mg via ORAL
  Filled 2023-05-03 (×2): qty 1

## 2023-05-03 MED ORDER — TICAGRELOR 90 MG PO TABS: 90.0000 mg | ORAL_TABLET | Freq: Two times a day (BID) | ORAL | Status: DC

## 2023-05-03 MED ORDER — POTASSIUM CHLORIDE 10 MEQ/100ML IV SOLN
10.0000 meq | INTRAVENOUS | Status: AC
  Administered 2023-05-03 (×4): 10 meq via INTRAVENOUS
  Filled 2023-05-03 (×4): qty 100

## 2023-05-03 MED ORDER — PRASUGREL HCL 10 MG PO TABS
60.0000 mg | ORAL_TABLET | Freq: Once | ORAL | Status: AC
  Administered 2023-05-03: 60 mg via ORAL
  Filled 2023-05-03: qty 6

## 2023-05-03 MED ORDER — SODIUM CHLORIDE 0.9% FLUSH
3.0000 mL | Freq: Two times a day (BID) | INTRAVENOUS | Status: DC
  Administered 2023-05-03 – 2023-05-05 (×4): 3 mL via INTRAVENOUS

## 2023-05-03 MED ORDER — INSULIN ASPART 100 UNIT/ML IJ SOLN
15.0000 [IU] | Freq: Three times a day (TID) | INTRAMUSCULAR | Status: DC
  Administered 2023-05-03 – 2023-05-05 (×4): 15 [IU] via SUBCUTANEOUS

## 2023-05-03 NOTE — Progress Notes (Signed)
 IC brief note:  The patient is a 58 year old with a history of hypertension, hyperlipidemia, type 2 diabetes not on insulin presents with a inferior ST elevation myocardial infarction.  He underwent stenting of his left circumflex and RCA with 1 and 2 drug-eluting stents respectively.  His lactate was mildly elevated 2.6.  He has residual high-grade mid LAD disease.  Overnight he has remained stable with occasional bouts of NSVT.   Vital signs blood pressure 101/64 heart rate 87 General: No apparent distress alert and orient x 3 HEENT: No JVP, no scleral icterus Cardiac: Regular rate and rhythm, no murmurs gallops rubs or thrills Respiratory: Clear to auscultation bilaterally Abdomen: Soft nontender nondistended positive bowel sounds Extremities: Right radial site is intact, warm and well-perfused Neuro: No signs or symptoms of stroke cranial nerves II through XII are grossly intact  Assessment and plan: 1.  Acute coronary syndrome: The patient underwent multivessel stenting yesterday for an acute coronary syndrome.  Will continue ticagrelor 90 twice daily, aspirin 81 mg, atorvastatin 80 mg and metoprolol 25 mg twice daily.  Will obtain echocardiogram today.  Will follow-up lactate.  Will plan on staged PCI of LAD tomorrow and possible discharge after PCI. 2.  NSVT: Continue beta-blocker; keep K greater than 4, mag greater than 2. 3.  Hyperlipidemia: Continue high-dose atorvastatin 4.  Type 2 diabetes mellitus: Will likely start SGLT2 inhibitor prior to discharge.

## 2023-05-03 NOTE — Telephone Encounter (Addendum)
 Patient Product/process development scientist completed.    The patient is insured through [facility]. Patient has [employer], may use a copay card, and/or apply for patient assistance if available.    Ran test claim for Brilitna 90 mg and the current 30 day co-pay is $50.00.  Ran test claim for Farxiga 10 mg and the current 30 day co-pay is $50.00  Ran test claim for Jardiance 10 mg and the current 30 day co-pay is $50.00.   This test claim was processed through [facility]- copay amounts may vary at other pharmacies due to pharmacy/plan contracts, or as the patient moves through the different stages of their insurance plan.     Roland Earl, CPHT Pharmacy Technician III Certified Patient Advocate [facility] Pharmacy Patient Advocate Team Direct Number: [phone]  Fax: [phone]

## 2023-05-03 NOTE — Progress Notes (Signed)
 PHARMACY - ANTICOAGULATION CONSULT NOTE  Pharmacy Consult for heparin Indication:  STEMI awaiting staged PCI  Allergies  Allergen Reactions   Keflex [Cephalexin]     Rash    Peanut-Containing Drug Products Other (See Comments)    Patient Measurements: Height: 5\' 7"  (170.2 cm) Weight: 86.2 kg (190 lb) IBW/kg (Calculated) : 66.1 Heparin Dosing Weight: 85kg  Vital Signs: Temp: 97.6 F (36.4 C) (03/17 2138) BP: 110/75 (03/18 0034) Pulse Rate: 0 (03/18 0049)  Labs: Recent Labs    05/02/23 2142 05/02/23 2229  HGB 14.7  --   HCT 42.2  --   PLT 268  --   APTT  --  >200*  LABPROT  --  14.2  INR  --  1.1  CREATININE 0.77  --   TROPONINIHS 5  --     Estimated Creatinine Clearance: 105.5 mL/min (by C-G formula based on SCr of 0.77 mg/dL).   Medical History: Past Medical History:  Diagnosis Date   Allergy    Asthma    Diabetes mellitus    sees Dr. Sharl Ma    GERD (gastroesophageal reflux disease)    Hyperlipidemia    Hypothyroidism     Medications:  Medications Prior to Admission  Medication Sig Dispense Refill Last Dose/Taking   albuterol (PROVENTIL HFA;VENTOLIN HFA) 108 (90 Base) MCG/ACT inhaler Inhale 2 puffs into the lungs every 4 (four) hours as needed for wheezing or shortness of breath. 1 Inhaler 5    BD INSULIN SYRINGE U/F 30G X 1/2" 0.5 ML MISC 1 SYRINGE THREE TIMES A DAY AS NEEDED      cetirizine (ZYRTEC) 10 MG tablet Take 10 mg by mouth daily.      dextromethorphan-guaiFENesin (MUCINEX DM) 30-600 MG 12hr tablet Take 1 tablet by mouth daily.      glucose blood (ONE TOUCH ULTRA TEST) test strip Use as instructed 100 each 11    levothyroxine (SYNTHROID, LEVOTHROID) 125 MCG tablet TAKE 1 TABLET (125 MCG TOTAL) BY MOUTH DAILY BEFORE BREAKFAST 90 tablet 2    metFORMIN (GLUCOPHAGE) 1000 MG tablet Take 1 tablet (1,000 mg total) by mouth 2 (two) times daily with a meal. 180 tablet 3    MOUNJARO 2.5 MG/0.5ML Pen SMARTSIG:2.5 Milligram(s) SUB-Q Once a Week       NOVOLOG 100 UNIT/ML injection SMARTSIG:10-20 Unit(s) SUB-Q PRN      ondansetron (ZOFRAN) 8 MG tablet Take 1 tablet (8 mg total) by mouth every 8 (eight) hours as needed for nausea or vomiting. 30 tablet 0    ondansetron (ZOFRAN-ODT) 8 MG disintegrating tablet Take 1 tablet (8 mg total) by mouth every 8 (eight) hours as needed for nausea or vomiting. 10 tablet 0    triamcinolone cream (KENALOG) 0.1 % Apply 1 application topically 3 (three) times daily. 45 g 5    Scheduled:   [START ON 05/04/2023] aspirin  81 mg Oral Daily   atorvastatin  80 mg Oral Daily   insulin aspart  15 Units Subcutaneous TID WC   levothyroxine  137 mcg Oral Q0600   metoprolol tartrate  25 mg Oral BID   [START ON 05/04/2023] ticagrelor  90 mg Oral BID    Assessment: 58yo male presented to [facility] c/o radiating CP associated w/ SOB and N/V, transferred to [facility] emergently as code STEMI, now s/p cardiac cath with successful stents to some lesions but awaiting staged PCI to address LAD, to resume heparin in interim.  Goal of Therapy:  Heparin level 0.3-0.7 units/ml Monitor platelets by anticoagulation protocol:  Yes   Plan:  Two hours after radial band removed start heparin infusion at 1100 units/hr. Monitor heparin levels and CBC.  Vernard Gambles, PharmD, BCPS  05/03/2023,1:04 AM

## 2023-05-03 NOTE — Progress Notes (Signed)
  Echocardiogram 2D Echocardiogram has been performed.  Ryan Decker 05/03/2023, 9:18 AM

## 2023-05-03 NOTE — Progress Notes (Signed)
 Patient is complaining of chest pain with mild SOB. States it is like previous chest pain but not as severe and more towards left side of chest. Placed on [place] @ 2L.  Notified Laverda Page NP. EKG completed. Dr Geralynn Rile at bedside.

## 2023-05-03 NOTE — Progress Notes (Signed)
 Met with wife. Explained need for staged intervention. Will restart IV heparin 2 hours post sheath hemostasis right radial. Patient presently chest pain free.  Ryan Decamp, MD, [facility] 05/03/2023, 1:01 AM [facility] [address] [place], [place] [address] Phone: [phone]. Fax:  [phone]

## 2023-05-03 NOTE — Progress Notes (Signed)
    Called by RN with patient having recurrent left sided chest pain and mild shortness of breath. Reports this is similar to his anginal pain but on the left side near his ribs. Repeat EKG shows sinus rhythm with evolving ST changes in inferior leads (similar to post PCI EKG).  -- morphine 2mg  x1 -- discussed with MD, will switch to Effient with 60mg  load this evening given his shortness of breath  Signed, Laverda Page, NP-C 05/03/2023, 5:15 PM Pager: [phone]

## 2023-05-03 NOTE — Progress Notes (Signed)
 eLink Physician-Brief Progress Note Patient Name: Ryan Decker DOB: 1965-09-14 MRN: [id number]   Date of Service  05/03/2023  HPI/Events of Note  58 year old male with a history of essential hypertension, dyslipidemia, diabetes who presented to the emergency department with chest pain and dyspnea associated with emesis found to have STEMI suggesting inferior infarct.  Vital signs reviewed and within normal limits results consistent with minimal electrolyte disturbances.  Normal CBC.  Anticoagulated on heparin infusion.  Radiograph unremarkable.  Left heart catheterization consistent with severely diseased coronaries with distal RCA severely diseased.  Need for staged LAD procedure in the morning.  eICU Interventions  Per cardiology, resume IV heparin post sheath hemostasis.  Dual antiplatelet therapy with aspirin and ticagrelor.  KCl ordered.  Repeat BMP/cbc in AM.  DVT prophylaxis with therapeutic heparin GI prophylaxis not indicated     Intervention Category Evaluation Type: New Patient Evaluation  Kainan Patty 05/03/2023, 1:08 AM

## 2023-05-04 ENCOUNTER — Encounter (HOSPITAL_COMMUNITY): Payer: Self-pay | Admitting: Cardiology

## 2023-05-04 ENCOUNTER — Encounter (HOSPITAL_COMMUNITY)
Admission: EM | Disposition: A | Discharge status: Home / Self Care | Payer: Self-pay | Source: Home / Self Care | Attending: Cardiology

## 2023-05-04 DIAGNOSIS — E785 Hyperlipidemia, unspecified: Secondary | ICD-10-CM | POA: Insufficient documentation

## 2023-05-04 DIAGNOSIS — I25118 Atherosclerotic heart disease of native coronary artery with other forms of angina pectoris: Secondary | ICD-10-CM

## 2023-05-04 LAB — COMPREHENSIVE METABOLIC PANEL
ALT: 36 U/L (ref 0–44)
AST: 89 U/L — ABNORMAL HIGH (ref 15–41)
Albumin: 3.2 g/dL — ABNORMAL LOW (ref 3.5–5.0)
Alkaline Phosphatase: 32 U/L — ABNORMAL LOW (ref 38–126)
Anion gap: 6 (ref 5–15)
BUN: 13 mg/dL (ref 6–20)
CO2: 26 mmol/L (ref 22–32)
Calcium: 8.8 mg/dL — ABNORMAL LOW (ref 8.9–10.3)
Chloride: 107 mmol/L (ref 98–111)
Creatinine, Ser: 0.79 mg/dL (ref 0.61–1.24)
GFR, Estimated: >60 mL/min (ref >60)
Glucose, Bld: 114 mg/dL — ABNORMAL HIGH (ref 70–99)
Potassium: 4.1 mmol/L (ref 3.5–5.1)
Sodium: 139 mmol/L (ref 135–145)
Total Bilirubin: 0.9 mg/dL (ref 0.0–1.2)
Total Protein: 5.9 g/dL — ABNORMAL LOW (ref 6.5–8.1)

## 2023-05-04 LAB — MAGNESIUM: Magnesium: 2 mg/dL (ref 1.7–2.4)

## 2023-05-04 LAB — CBC
HCT: 39.4 % (ref 39.0–52.0)
Hemoglobin: 13.2 g/dL (ref 13.0–17.0)
MCH: 30.6 pg (ref 26.0–34.0)
MCHC: 33.5 g/dL (ref 30.0–36.0)
MCV: 91.2 fL (ref 80.0–100.0)
Platelets: 234 10*3/uL (ref 150–400)
RBC: 4.32 MIL/uL (ref 4.22–5.81)
RDW: 12.7 % (ref 11.5–15.5)
WBC: 7.7 10*3/uL (ref 4.0–10.5)
nRBC: 0 % (ref 0.0–0.2)

## 2023-05-04 LAB — LIPOPROTEIN A (LPA): Lipoprotein (a): <8.4 nmol/L (ref <75.0)

## 2023-05-04 LAB — POCT ACTIVATED CLOTTING TIME
Activated Clotting Time: 331 s
Activated Clotting Time: 389 s

## 2023-05-04 LAB — GLUCOSE, CAPILLARY
Glucose-Capillary: 130 mg/dL — ABNORMAL HIGH (ref 70–99)
Glucose-Capillary: 106 mg/dL — ABNORMAL HIGH (ref 70–99)

## 2023-05-04 SURGERY — CORONARY STENT INTERVENTION
Anesthesia: LOCAL

## 2023-05-04 MED ORDER — NITROGLYCERIN 1 MG/10 ML FOR IR/CATH LAB
INTRA_ARTERIAL | Status: AC
  Filled 2023-05-04: qty 10

## 2023-05-04 MED ORDER — SODIUM CHLORIDE 0.9 % WEIGHT BASED INFUSION
1.0000 mL/kg/h | INTRAVENOUS | Status: DC
  Administered 2023-05-04: 1 mL/kg/h via INTRAVENOUS

## 2023-05-04 MED ORDER — FENTANYL CITRATE (PF) 100 MCG/2ML IJ SOLN
INTRAMUSCULAR | Status: AC
  Filled 2023-05-04: qty 2

## 2023-05-04 MED ORDER — EMPAGLIFLOZIN 10 MG PO TABS
10.0000 mg | ORAL_TABLET | Freq: Every day | ORAL | Status: DC
  Administered 2023-05-05: 10 mg via ORAL
  Filled 2023-05-04 (×2): qty 1

## 2023-05-04 MED ORDER — CHLORHEXIDINE GLUCONATE CLOTH 2 % EX PADS
6.0000 | MEDICATED_PAD | Freq: Every day | CUTANEOUS | Status: DC
  Administered 2023-05-04 – 2023-05-05 (×2): 6 via TOPICAL

## 2023-05-04 MED ORDER — SODIUM CHLORIDE 0.9% FLUSH: 3.0000 mL | INTRAVENOUS | Status: DC | PRN

## 2023-05-04 MED ORDER — FENTANYL CITRATE (PF) 100 MCG/2ML IJ SOLN
INTRAMUSCULAR | Status: DC | PRN
  Administered 2023-05-04 (×2): 25 ug via INTRAVENOUS

## 2023-05-04 MED ORDER — MIDAZOLAM HCL 2 MG/2ML IJ SOLN
INTRAMUSCULAR | Status: DC | PRN
Start: 2023-05-04 — End: 2023-05-04
  Administered 2023-05-04 (×2): 1 mg via INTRAVENOUS

## 2023-05-04 MED ORDER — MORPHINE SULFATE (PF) 2 MG/ML IV SOLN: 2.0000 mg | INTRAVENOUS | Status: DC | PRN

## 2023-05-04 MED ORDER — PRASUGREL HCL 10 MG PO TABS
ORAL_TABLET | ORAL | Status: DC | PRN
  Administered 2023-05-04: 10 mg via ORAL

## 2023-05-04 MED ORDER — SODIUM CHLORIDE 0.9 % IV SOLN: INTRAVENOUS | Status: AC

## 2023-05-04 MED ORDER — PRASUGREL HCL 10 MG PO TABS
ORAL_TABLET | ORAL | Status: AC
  Filled 2023-05-04: qty 1

## 2023-05-04 MED ORDER — VERAPAMIL HCL 2.5 MG/ML IV SOLN
INTRAVENOUS | Status: AC
  Filled 2023-05-04: qty 2

## 2023-05-04 MED ORDER — HYDRALAZINE HCL 20 MG/ML IJ SOLN: 10.0000 mg | INTRAMUSCULAR | Status: AC | PRN

## 2023-05-04 MED ORDER — HEPARIN SODIUM (PORCINE) 1000 UNIT/ML IJ SOLN
INTRAMUSCULAR | Status: DC | PRN
Start: 2023-05-04 — End: 2023-05-04
  Administered 2023-05-04: 9000 [IU] via INTRAVENOUS

## 2023-05-04 MED ORDER — MIDAZOLAM HCL 2 MG/2ML IJ SOLN
INTRAMUSCULAR | Status: AC
  Filled 2023-05-04: qty 2

## 2023-05-04 MED ORDER — LABETALOL HCL 5 MG/ML IV SOLN: 10.0000 mg | INTRAVENOUS | Status: AC | PRN

## 2023-05-04 MED ORDER — HEPARIN (PORCINE) IN NACL 1000-0.9 UT/500ML-% IV SOLN
INTRAVENOUS | Status: DC | PRN
  Administered 2023-05-04: 1000 mL via INTRA_ARTERIAL

## 2023-05-04 MED ORDER — HEPARIN SODIUM (PORCINE) 1000 UNIT/ML IJ SOLN
INTRAMUSCULAR | Status: AC
  Filled 2023-05-04: qty 10

## 2023-05-04 MED ORDER — LIDOCAINE HCL (PF) 1 % IJ SOLN
INTRAMUSCULAR | Status: AC
Start: 2023-05-04 — End: ?
  Filled 2023-05-04: qty 30

## 2023-05-04 MED ORDER — SODIUM CHLORIDE 0.9 % IV SOLN
INTRAVENOUS | Status: DC | PRN
  Administered 2023-05-04: 250 mL via INTRAVENOUS

## 2023-05-04 MED ORDER — IOHEXOL 350 MG/ML SOLN
INTRAVENOUS | Status: DC | PRN
  Administered 2023-05-04: 220 mL via INTRA_ARTERIAL

## 2023-05-04 MED ORDER — SODIUM CHLORIDE 0.9 % IV SOLN: 250.0000 mL | INTRAVENOUS | Status: DC | PRN

## 2023-05-04 MED ORDER — VERAPAMIL HCL 2.5 MG/ML IV SOLN
INTRAVENOUS | Status: DC | PRN
  Administered 2023-05-04: 10 mL via INTRA_ARTERIAL

## 2023-05-04 MED ORDER — ASPIRIN 81 MG PO CHEW
81.0000 mg | CHEWABLE_TABLET | ORAL | Status: AC
  Administered 2023-05-04: 81 mg via ORAL

## 2023-05-04 MED ORDER — SODIUM CHLORIDE 0.9% FLUSH
3.0000 mL | Freq: Two times a day (BID) | INTRAVENOUS | Status: DC
  Administered 2023-05-04 – 2023-05-05 (×2): 3 mL via INTRAVENOUS

## 2023-05-04 MED ORDER — SODIUM CHLORIDE 0.9 % WEIGHT BASED INFUSION: 3.0000 mL/kg/h | INTRAVENOUS | Status: DC

## 2023-05-04 SURGICAL SUPPLY — 23 items
BALL SAPPHIRE NC24 3.25X15 (BALLOONS) ×1 IMPLANT
BALLN EMERGE MR 2.0X15 (BALLOONS) ×1 IMPLANT
BALLN EMERGE MR 2.5X12 (BALLOONS) ×1 IMPLANT
BALLN SCOREFLEX 2.75X15 (BALLOONS) ×1 IMPLANT
BALLOON EMERGE MR 2.0X15 (BALLOONS) IMPLANT
BALLOON EMERGE MR 2.5X12 (BALLOONS) IMPLANT
BALLOON SAPPHIRE NC24 3.25X15 (BALLOONS) IMPLANT
BALLOON SCOREFLEX 2.75X15 (BALLOONS) IMPLANT
CATH DRAGONFLY OPSTAR (CATHETERS) IMPLANT
CATH VISTA GUIDE 6FR XBLD 3.5 (CATHETERS) IMPLANT
DEVICE RAD COMP TR BAND LRG (VASCULAR PRODUCTS) IMPLANT
ELECT DEFIB PAD ADLT CADENCE (PAD) IMPLANT
GLIDESHEATH SLEND SS 6F .021 (SHEATH) IMPLANT
GUIDEWIRE INQWIRE 1.5J.035X260 (WIRE) IMPLANT
INQWIRE 1.5J .035X260CM (WIRE) ×1 IMPLANT
KIT ENCORE 26 ADVANTAGE (KITS) IMPLANT
PACK CARDIAC CATHETERIZATION (CUSTOM PROCEDURE TRAY) ×1 IMPLANT
SET ATX-X65L (MISCELLANEOUS) IMPLANT
SHEATH PROBE COVER 6X72 (BAG) IMPLANT
STENT ONYX FRONTIER 2.0X15 (Permanent Stent) IMPLANT
STENT ONYX FRONTIER 2.75X34 (Permanent Stent) IMPLANT
STOPCOCK MORSE 400PSI 3WAY (MISCELLANEOUS) IMPLANT
WIRE ASAHI PROWATER 180CM (WIRE) IMPLANT

## 2023-05-04 NOTE — Progress Notes (Addendum)
   Patient Name: Ryan Decker Date of Encounter: 05/04/2023 [facility] Cardiologist: Yates Decamp, MD   Interval Summary  .    Left flank pain last night >> EKG negative  Ticagrelor changed to Prasugrel given dyspnea.  No acute events overnight, feels well, slept well  Staged LAD PCI today.    Vital Signs .    Vitals:   05/04/23 0400 05/04/23 0430 05/04/23 0500 05/04/23 0700  BP: 97/60 93/71 (!) 89/61   Pulse: 88 81 81   Resp: 14 (!) 7 19   Temp:    98.6 F (37 C)  TempSrc:    Oral  SpO2: 96% 96% 97%   Weight:      Height:        Intake/Output Summary (Last 24 hours) at 05/04/2023 0800 Last data filed at 05/03/2023 2208 Gross per 24 hour  Intake 903 ml  Output 1100 ml  Net -197 ml      05/03/2023    2:44 AM 05/02/2023    9:34 PM 12/10/2021   11:00 AM  Last 3 Weights  Weight (lbs) 189 lb 9.5 oz 190 lb 195 lb  Weight (kg) 86 kg 86.183 kg 88.451 kg      Telemetry/ECG    AIVR yesterday 12:30, OTW SR - Personally Reviewed  Physical Exam .   GEN: No acute distress.   Neck: No JVD Cardiac: RRR, no murmurs, rubs, or gallops.  Respiratory: Clear to auscultation bilaterally. GI: Soft, nontender, non-distended  MS: No edema VASC:  R radial intact  Assessment & Plan .     1.  Acute coronary syndrome/inferior STEMI: Will continue prasugrel 10mg  qday, aspirin 81 mg, atorvastatin 80 mg and metoprolol 25 mg twice daily.  Echocardiogram with preserved LV function with mild inferolateral HK, EF 50-55%.  staged PCI of LAD today and possible discharge after PCI.  2.  NSVT: Continue beta-blocker; keep K greater than 4, mag greater than 2. 3.  Hyperlipidemia: Continue high-dose atorvastatin 4.  Type 2 diabetes mellitus: Start Jardiance 10mg  Qday tomorrow.  For questions or updates, please contact [facility] Please consult [email or web address] for contact info under        Signed, Orbie Pyo, MD

## 2023-05-04 NOTE — Progress Notes (Signed)
 Taken to [facility] for procedure.

## 2023-05-04 NOTE — Discharge Summary (Incomplete)
 Discharge Summary    Patient ID: Ryan Decker MRN: [id number]; DOB: [birth year]  Admit date: 05/02/2023 Discharge date: 05/05/2023  PCP:  Farris Has, MD   [facility] Providers Cardiologist:  Yates Decamp, MD   {   Discharge Diagnoses    Principal Problem:   Acute ST elevation myocardial infarction (STEMI) of inferior wall [facility]) Active Problems:   Hypothyroidism   Diabetes mellitus without complication (HCC)   GERD   Hyperlipidemia  Diagnostic Studies/Procedures  Cardiac catheterization 05/02/2023  Hemodynamic data: LVEDP 17 mmHg, no pressure gradient across the aortic valve.   Angiographic data: LV: Normal LV systolic function, EF 55%.  No wall motion abnormality. LM: Calcified but is widely patent. LCx: Tiny OM 1 and 2, followed by diffusely diseased and occluded with both ipsilateral collaterals from LAD and also bridging collaterals with TIMI 2 flow in a large OM 3. LAD: Gives origin to a very large D1 which has ostial 50% stenosis.  Proximal LAD is calcified.  The mid segment of the LAD is diffusely diseased with tandem 99% followed by a 60% stenosis, followed by minimal disease and a focal to tandem 80% stenosis in the mid to distal segments.  Apical LAD is diffusely diseased. RCA: Dominant vessel, mid RCA has a 99% stenosis followed by diffusely diseased 70% stenosis just distal to this, distal LAD is severely diseased diffusely with 85% stenosis followed by large PL and PDA branches which are again severely diseased.  PDA is a focal 99% stenosis.   Intervention data: Unsuccessful attempt at crossing the circumflex CTO which appeared to be initial culprit along with RCA, however after attempting to cross the lesion with balloon, 1.5 Takuru balloon could not completely cross the distal occlusion, lesion left alone. Successful PTCA and stenting of the proximal and mid RCA stenosis of 99% reduced to 0% with implantation of 2 overlapping 3.0 x 28 and a 3.0 x 12 mm  Synergy XD DES deployed at 14 and 16 atmospheric pressure respectively and postdilated between the stent struts with a 3.0 x 12 mm stent balloon at 16 atmospheric pressure.  TIMI III to TIMI-3 flow.     Impression and recommendations: Patient has severely diseased coronary arteries and does not appear to have good targets for CABG.  Distal RCA is severely diseased.  Medical therapy for the same.  Patient needs intervention to the LAD stenosis and will be set up for tomorrow.  CX occlusion is a CTO, unless recurrence of angina, treated medically.  100 mL contrast utilized.   Echocardiogram 05/03/2023 1. Hypokinesis of the basal inferior/ inferolateral wall with overall low normal LV function.  2. Left ventricular ejection fraction, by estimation, is 50 to 55% . The left ventricle has low normal function. The left ventricle demonstrates regional wall motion abnormalities (see scoring diagram/ findings for description) . There is mild left ventricular hypertrophy of the basal- septal segment. Left ventricular diastolic parameters are consistent with Grade I diastolic dysfunction (impaired relaxation) .  3. Right ventricular systolic function is normal. The right ventricular size is normal. Tricuspid regurgitation signal is inadequate for assessing PA pressure.  4. The mitral valve is normal in structure. No evidence of mitral valve regurgitation. No evidence of mitral stenosis.  5. The aortic valve is tricuspid. Aortic valve regurgitation is not visualized. No aortic stenosis is present.  6. The inferior vena cava is normal in size with greater than 50% respiratory variability, suggesting right atrial pressure of 3 mmHg.  Coronary stent intervention  05/04/2023   1st Diag lesion is 60% stenosed.   Lesion #1: Prox LAD to Mid LAD lesion is 99% stenosed.  Mid LAD lesion is 70% stenosed.   OCT guided scoreflex PTCA and DES PCI performed.   A drug-eluting stent was successfully placed covering both lesions,  using a STENT ONYX FRONTIER [id number]. => Postdilated tapered fashion from 3.4 to 2.9 mm.  Post intervention, there is a 0% residual stenosis throughout the stented segment.  (Confirmed by OCT).   Dist LAD-1 lesion is 50% stenosed.   Lesion #2: Dist LAD-2 lesion is 90% stenosed.   A drug-eluting stent was successfully placed using a STENT ONYX FRONTIER 2.0X15 => deployed to 2.3 mm. Post intervention, there is a 0% residual stenosis.   Dist LAD-3 lesion is 95% stenosed.   Prox Cx to Dist Cx lesion is 100% stenosed.  (CTO)   RCA not imaged.    Successful 2 site PCI of the LAD: Complex PCI based on length of lesions, use of multiple different balloons and 2 lesions treated.  Prox-to-mid LAD tandem 80 and 70% stenosis treated with OCT guided & ScoreFlex angioplasty supported DES PCI with Onyx frontier DES 2.75 mm x 34 mm deployed to 2.9 mm and postdilated and taper fashion from 3.4 to 3.0 mm.  Distal LAD 80% stenosis reduced to 0% with Onyx frontier DES 2.0 mm x 15 mm deployed to 2.2 mm    RECOMMENDATIONS In the absence of any other complications or medical issues, we expect the patient to be ready for discharge from an interventional cardiology perspective on 05/05/2023. Recommend uninterrupted dual antiplatelet therapy with Aspirin 81mg  daily and Prasugrel 10mg  daily for a minimum of 12 months (ACS-Class I recommendation). Consider staged "CTO PCI"of LCx in 3 to 4 weeks if stable, depending on symptoms.  (Would be preferable to perform before too long since there was a channel for the lesion to be crossed with a guidewire.  (Was not attempted during this procedure due to length of the case and contrast use)    History of Present Illness     Ryan Decker is a 58 y.o. male with past medical history of asthma, GERD, hyperlipidemia, hypothyroidism, type 2 diabetes who presented to the [facility] ED on 05/02/2023 complaining of chest pain and shortness of breath. He described the pain as mid-chest,  radiating to shoulders/down both arms with mild shortness of breath that began 30 minutes PTA. He also reports two episodes of emesis. Denied any prior cardiac history.   Hospital Course     Dr. Jacinto Halim was contacted as the on-call STEMI doctor that evening and although the patients initial EKG changes were borderline for STEMI criteria with symptoms and dynamic changes he was taken emergently to the cath lab per Dr. Jacinto Halim.  Repeat EKG performed by [facility] at 22:58 showed STE in III, aVF with minimal ST depression in I, aVL suggesting acute inferior infarction.   Patient was taken emergently to the cath lab at [facility]. He underwent LHC which showed severe diffuse disease. He underwent PCI of LCX, RCA, PCI was unsuccessful in LCX and successful in RCA with placement of 2 overlapping DES. There was remaining residual high-grade mid LAD disease. He was scheduled for staged PCI of LAD on 3/19.  Overnight in between original cath and staged PCI of LAD, he remained relatively stable with occasional episodes of NSVT on telemetry. He was on Brillinta 90 mg BID, ASA 81 mg daily, Lipitor 80 mg daily, Lopressor 25 mg BID  with plans to start Jardiance 10 mg daily on 3/20. On 3/17 he had an elevated lactate at 2.6 that cleared to 1.1 on 3/18.  Echocardiogram was completed on 3/18 that showed: hypokinesis of basal inferior/inferolateral wall, EF 50-55%, LV with RWMA, mild LVH, G1DD, normal RV function, normal IVC.  Night of 3/18 patient reported having recurrent left-sided chest pain and mild shortness of breath. Repeat EKG was obtained which showed evovlving ST changes in inferior leads, similar to post PCI EKG. Patient was given morphine 2 mg x 1 dose, switched to Effient -- given 60 mg load.   Patient was then taken back to cath lab afternoon of 3/19 to undergo staged PCI of LAD lesion. He had a successful two site PCI of LAD with DES x 2. The recommendations from this cath include uninterrupted DAPT for at  least 12 months, consideration of possible CTO PCI of LCX in 3-4 weeks if patient is stable.    Patient was seen by Dr. Lynnette Caffey and deemed medically safe for discharge.     Did the patient have an acute coronary syndrome (MI, NSTEMI, STEMI, etc) this admission?:  Yes                               AHA/ACC ACS Clinical Performance & Quality Measures: Aspirin prescribed? - Yes ADP Receptor Inhibitor (Plavix/Clopidogrel, Brilinta/Ticagrelor or Effient/Prasugrel) prescribed (includes medically managed patients)? - Yes Beta Blocker prescribed? - Yes High Intensity Statin (Lipitor 40-80mg  or Crestor 20-40mg ) prescribed? - Yes EF assessed during THIS hospitalization? - Yes For EF <40%, was ACEI/ARB prescribed? - Not Applicable (EF >/= 40%) For EF <40%, Aldosterone Antagonist (Spironolactone or Eplerenone) prescribed? - Not Applicable (EF >/= 40%) Cardiac Rehab Phase II ordered (including medically managed patients)? - Yes  _____________  Discharge Vitals Blood pressure 114/68, pulse 97, temperature 97.8 F (36.6 C), temperature source Oral, resp. rate (!) 0, height 5\' 7"  (1.702 m), weight 86 kg, SpO2 98%.  Filed Weights   05/02/23 2134 05/03/23 0244  Weight: 86.2 kg 86 kg   Labs & Radiologic Studies    CBC Recent Labs    05/04/23 0322 05/05/23 0223  WBC 7.7 8.6  HGB 13.2 13.3  HCT 39.4 38.8*  MCV 91.2 90.7  PLT 234 253   Basic Metabolic Panel Recent Labs    14/78/29 0322 05/05/23 0223  NA 139 138  K 4.1 3.8  CL 107 107  CO2 26 23  GLUCOSE 114* 142*  BUN 13 14  CREATININE 0.79 0.70  CALCIUM 8.8* 8.6*  MG 2.0  --    Liver Function Tests Recent Labs    05/03/23 0446 05/04/23 0322  AST 61* 89*  ALT 31 36  ALKPHOS 35* 32*  BILITOT 0.7 0.9  PROT 5.8* 5.9*  ALBUMIN 3.2* 3.2*   No results for input(s): "LIPASE", "AMYLASE" in the last 72 hours. High Sensitivity Troponin:   Recent Labs  Lab 05/02/23 2142 05/03/23 0446  TROPONINIHS 5 7,729*    BNP Invalid  input(s): "POCBNP" D-Dimer No results for input(s): "DDIMER" in the last 72 hours. Hemoglobin A1C Recent Labs    05/02/23 2229  HGBA1C 7.8*   Fasting Lipid Panel Recent Labs    05/02/23 2229  CHOL 174  HDL 45  LDLCALC 93  TRIG 178*  CHOLHDL 3.9   Thyroid Function Tests Recent Labs    05/02/23 2229  TSH 0.056*  _____________  CARDIAC CATHETERIZATION Result Date: 05/04/2023  Table formatting from the original result was not included. Images from the original result were not included.   1st Diag lesion is 60% stenosed.   Lesion #1: Prox LAD to Mid LAD lesion is 99% stenosed.  Mid LAD lesion is 70% stenosed.   OCT guided scoreflex PTCA and DES PCI performed.   A drug-eluting stent was successfully placed covering both lesions, using a STENT ONYX FRONTIER [id number]. => Postdilated tapered fashion from 3.4 to 2.9 mm.  Post intervention, there is a 0% residual stenosis throughout the stented segment.  (Confirmed by OCT).   Dist LAD-1 lesion is 50% stenosed.   Lesion #2: Dist LAD-2 lesion is 90% stenosed.   A drug-eluting stent was successfully placed using a STENT ONYX FRONTIER 2.0X15 => deployed to 2.3 mm. Post intervention, there is a 0% residual stenosis.   Dist LAD-3 lesion is 95% stenosed.   Prox Cx to Dist Cx lesion is 100% stenosed.  (CTO)   RCA not imaged. Diagnostic (Dominance: Right - RA not imaged)    Intervention Successful 2 site PCI of the LAD: Complex PCI based on length of lesions, use of multiple different balloons and 2 lesions treated. Prox-to-mid LAD tandem 80 and 70% stenosis treated with OCT guided & ScoreFlex angioplasty supported DES PCI with Onyx frontier DES 2.75 mm x 34 mm deployed to 2.9 mm and postdilated and taper fashion from 3.4 to 3.0 mm. Distal LAD 80% stenosis reduced to 0% with Onyx frontier DES 2.0 mm x 15 mm deployed to 2.2 mm RECOMMENDATIONS In the absence of any other complications or medical issues, we expect the patient to be ready for discharge from an  interventional cardiology perspective on 05/05/2023. Recommend uninterrupted dual antiplatelet therapy with Aspirin 81mg  daily and Prasugrel 10mg  daily for a minimum of 12 months (ACS-Class I recommendation). Consider staged "CTO PCI"of LCx in 3 to 4 weeks if stable, depending on symptoms.  (Would be preferable to perform before too long since there was a channel for the lesion to be crossed with a guidewire.  (Was not attempted during this procedure due to length of the case and contrast use) Bryan Lemma, MD  ECHOCARDIOGRAM COMPLETE Result Date: 05/03/2023    ECHOCARDIOGRAM REPORT   Patient Name:   Ryan Decker Date of Exam: 05/03/2023 Medical Rec #:  [id number]       Height:       67.0 in Accession #:    [id number]      Weight:       189.6 lb Date of Birth:  1965/08/10      BSA:          1.977 m Patient Age:    57 years        BP:           118/80 mmHg Patient Gender: M               HR:           79 bpm. Exam Location:  Inpatient Procedure: 2D Echo, Cardiac Doppler, Color Doppler and Intracardiac            Opacification Agent (Both Spectral and Color Flow Doppler were            utilized during procedure). Indications:    122-I22.9 Subsequent ST elevation (STEM) and non-ST elevation                 (NSTEMI) myocardial infarction  History:        Patient  has no prior history of Echocardiogram examinations. CAD                 and Acute MI; Risk Factors:Diabetes and Dyslipidemia.  Sonographer:    Sheralyn Boatman RDCS Referring Phys: [id number] Yates Decamp  Sonographer Comments: Technically difficult study due to poor echo windows. IMPRESSIONS  1. Hypokinesis of the basal inferior/inferolateral wall with overall low normal LV function.  2. Left ventricular ejection fraction, by estimation, is 50 to 55%. The left ventricle has low normal function. The left ventricle demonstrates regional wall motion abnormalities (see scoring diagram/findings for description). There is mild left ventricular hypertrophy of the basal-septal  segment. Left ventricular diastolic parameters are consistent with Grade I diastolic dysfunction (impaired relaxation).  3. Right ventricular systolic function is normal. The right ventricular size is normal. Tricuspid regurgitation signal is inadequate for assessing PA pressure.  4. The mitral valve is normal in structure. No evidence of mitral valve regurgitation. No evidence of mitral stenosis.  5. The aortic valve is tricuspid. Aortic valve regurgitation is not visualized. No aortic stenosis is present.  6. The inferior vena cava is normal in size with greater than 50% respiratory variability, suggesting right atrial pressure of 3 mmHg. Comparison(s): No prior Echocardiogram. FINDINGS  Left Ventricle: Left ventricular ejection fraction, by estimation, is 50 to 55%. The left ventricle has low normal function. The left ventricle demonstrates regional wall motion abnormalities. Definity contrast agent was given IV to delineate the left ventricular endocardial borders. The left ventricular internal cavity size was normal in size. There is mild left ventricular hypertrophy of the basal-septal segment. Left ventricular diastolic parameters are consistent with Grade I diastolic dysfunction  (impaired relaxation). Right Ventricle: The right ventricular size is normal. Right ventricular systolic function is normal. Tricuspid regurgitation signal is inadequate for assessing PA pressure. Left Atrium: Left atrial size was normal in size. Right Atrium: Right atrial size was normal in size. Pericardium: There is no evidence of pericardial effusion. Mitral Valve: The mitral valve is normal in structure. Mild mitral annular calcification. No evidence of mitral valve regurgitation. No evidence of mitral valve stenosis. Tricuspid Valve: The tricuspid valve is normal in structure. Tricuspid valve regurgitation is trivial. No evidence of tricuspid stenosis. Aortic Valve: The aortic valve is tricuspid. Aortic valve regurgitation is  not visualized. No aortic stenosis is present. Pulmonic Valve: The pulmonic valve was not well visualized. Pulmonic valve regurgitation is not visualized. No evidence of pulmonic stenosis. Aorta: The aortic root is normal in size and structure. Venous: The inferior vena cava is normal in size with greater than 50% respiratory variability, suggesting right atrial pressure of 3 mmHg. IAS/Shunts: No atrial level shunt detected by color flow Doppler. Additional Comments: Hypokinesis of the basal inferior/inferolateral wall with overall low normal LV function.  LEFT VENTRICLE PLAX 2D LVIDd:         4.50 cm      Diastology LVIDs:         3.40 cm      LV e' medial:    4.57 cm/s LV PW:         1.00 cm      LV E/e' medial:  10.8 LV IVS:        1.40 cm      LV e' lateral:   6.31 cm/s LVOT diam:     2.40 cm      LV E/e' lateral: 7.8 LV SV:         54 LV SV Index:  27 LVOT Area:     4.52 cm  LV Volumes (MOD) LV vol d, MOD A2C: 117.0 ml LV vol d, MOD A4C: 131.0 ml LV vol s, MOD A2C: 56.5 ml LV vol s, MOD A4C: 73.8 ml LV SV MOD A2C:     60.5 ml LV SV MOD A4C:     131.0 ml LV SV MOD BP:      58.5 ml RIGHT VENTRICLE             IVC RV S prime:     10.30 cm/s  IVC diam: 1.50 cm TAPSE (M-mode): 1.1 cm LEFT ATRIUM             Index        RIGHT ATRIUM          Index LA Vol (A2C):   31.7 ml 16.04 ml/m  RA Area:     6.36 cm LA Vol (A4C):   15.0 ml 7.59 ml/m   RA Volume:   8.94 ml  4.52 ml/m LA Biplane Vol: 23.0 ml 11.63 ml/m  AORTIC VALVE LVOT Vmax:   60.80 cm/s LVOT Vmean:  39.300 cm/s LVOT VTI:    0.119 m  AORTA Ao Root diam: 3.30 cm Ao Asc diam:  3.30 cm MITRAL VALVE MV Area (PHT): 4.10 cm    SHUNTS MV Decel Time: 185 msec    Systemic VTI:  0.12 m MV E velocity: 49.30 cm/s  Systemic Diam: 2.40 cm MV A velocity: 69.95 cm/s MV E/A ratio:  0.70 Olga Millers MD Electronically signed by Olga Millers MD Signature Date/Time: 05/03/2023/11:42:25 AM    Final    CARDIAC CATHETERIZATION Result Date: 05/03/2023 Images from the  original result were not included. Left Heart Catheterization 05/03/23: Hemodynamic data: LVEDP 17 mmHg, no pressure gradient across the aortic valve. Angiographic data: LV: Normal LV systolic function, EF 55%.  No wall motion abnormality. LM: Calcified but is widely patent. LCx: Tiny OM 1 and 2, followed by diffusely diseased and occluded with both ipsilateral collaterals from LAD and also bridging collaterals with TIMI 2 flow in a large OM 3. LAD: Gives origin to a very large D1 which has ostial 50% stenosis.  Proximal LAD is calcified.  The mid segment of the LAD is diffusely diseased with tandem 99% followed by a 60% stenosis, followed by minimal disease and a focal to tandem 80% stenosis in the mid to distal segments.  Apical LAD is diffusely diseased. RCA: Dominant vessel, mid RCA has a 99% stenosis followed by diffusely diseased 70% stenosis just distal to this, distal LAD is severely diseased diffusely with 85% stenosis followed by large PL and PDA branches which are again severely diseased.  PDA is a focal 99% stenosis. Intervention data: Unsuccessful attempt at crossing the circumflex CTO which appeared to be initial culprit along with RCA, however after attempting to cross the lesion with balloon, 1.5 Takuru balloon could not completely cross the distal occlusion, lesion left alone. Successful PTCA and stenting of the proximal and mid RCA stenosis of 99% reduced to 0% with implantation of 2 overlapping 3.0 x 28 and a 3.0 x 12 mm Synergy XD DES deployed at 14 and 16 atmospheric pressure respectively and postdilated between the stent struts with a 3.0 x 12 mm stent balloon at 16 atmospheric pressure.  TIMI III to TIMI-3 flow. Impression and recommendations: Patient has severely diseased coronary arteries and does not appear to have good targets for CABG.  Distal RCA is severely diseased.  Medical therapy for the same.  Patient needs intervention to the LAD stenosis and will be set up for tomorrow.  CX  occlusion is a CTO, unless recurrence of angina, treated medically.  100 mL contrast utilized.   DG Chest 2 View Result Date: 05/02/2023 CLINICAL DATA:  Mid chest pain. EXAM: CHEST - 2 VIEW COMPARISON:  January 23, 2022 FINDINGS: The heart size and mediastinal contours are within normal limits. Both lungs are clear. The visualized skeletal structures are unremarkable. IMPRESSION: No active cardiopulmonary disease. Electronically Signed   By: Aram Candela M.D.   On: 05/02/2023 22:13   Disposition   Pt is being discharged home today in good condition per MD.  Follow-up Plans & Appointments   Future Appointments  Date Time Provider Department Center  05/10/2023  8:25 AM Beatrice Lecher, PA-C [facility]   Discharge Instructions     AMB Referral to Cardiac Rehabilitation - Phase II   Complete by: As directed    Diagnosis:  STEMI Coronary Stents     After initial evaluation and assessments completed: Virtual Based Care may be provided alone or in conjunction with Phase 2 Cardiac Rehab based on patient barriers.: Yes   Intensive Cardiac Rehabilitation (ICR) [facility] location only OR Traditional Cardiac Rehabilitation (TCR) *If criteria for ICR are not met will enroll in TCR [facility] only): Yes   Amb Referral to Cardiac Rehabilitation   Complete by: As directed    Diagnosis:  Coronary Stents STEMI     After initial evaluation and assessments completed: Virtual Based Care may be provided alone or in conjunction with Phase 2 Cardiac Rehab based on patient barriers.: Yes   Intensive Cardiac Rehabilitation (ICR) [facility] location only OR Traditional Cardiac Rehabilitation (TCR) *If criteria for ICR are not met will enroll in TCR [facility] only): Yes   Call MD for:  extreme fatigue   Complete by: As directed    Call MD for:  persistant dizziness or light-headedness   Complete by: As directed    Call MD for:  redness, tenderness, or signs of infection (pain, swelling, redness, odor or  green/yellow discharge around incision site)   Complete by: As directed    Discharge instructions   Complete by: As directed    PLEASE DO NOT MISS ANY DOSES OF YOUR EFFIENT!!!!! Also keep a log of you blood pressures and bring back to your follow up appt. Please call the office with any questions.   Patients taking blood thinners should generally stay away from medicines like ibuprofen, Advil, Motrin, naproxen, and Aleve due to risk of stomach bleeding. You may take Tylenol as directed or talk to your primary doctor about alternatives.  PLEASE ENSURE THAT YOU DO NOT RUN OUT OF YOUR EFFIENT. This medication is very important to remain on for at least one year. IF you have issues obtaining this medication due to cost please CALL the office 3-5 business days prior to running out in order to prevent missing doses of this medication.   Radial Site Care Refer to this sheet in the next few weeks. These instructions provide you with information on caring for yourself after your procedure. Your caregiver may also give you more specific instructions. Your treatment has been planned according to current medical practices, but problems sometimes occur. Call your caregiver if you have any problems or questions after your procedure.  HOME CARE INSTRUCTIONS You may shower the day after the procedure. Remove the bandage (dressing) and gently wash the site with plain soap  and water. Gently pat the site dry.  Do not apply powder or lotion to the site.  Do not submerge the affected site in water for 3 to 5 days.  Inspect the site at least twice daily.  Do not flex or bend the affected arm for 24 hours.  No lifting over 5 pounds (2.3 kg) for 5 days after your procedure.  Do not drive home if you are discharged the same day of the procedure. Have someone else drive you.  You may drive 24 hours after the procedure unless otherwise instructed by your caregiver.  What to expect: Any bruising will usually fade within  1 to 2 weeks.  Blood that collects in the tissue (hematoma) may be painful to the touch. It should usually decrease in size and tenderness within 1 to 2 weeks.   SEEK IMMEDIATE MEDICAL CARE IF: You have unusual pain at the radial site.  You have redness, warmth, swelling, or pain at the radial site.  You have drainage (other than a small amount of blood on the dressing).  You have chills.  You have a fever or persistent symptoms for more than 72 hours.  You have a fever and your symptoms suddenly get worse.  Your arm becomes pale, cool, tingly, or numb.  You have heavy bleeding from the site. Hold pressure on the site.   Increase activity slowly   Complete by: As directed       Discharge Medications   Allergies as of 05/05/2023       Reactions   Keflex [cephalexin]    Rash   Peanut-containing Drug Products Itching        Medication List     STOP taking these medications    Farxiga 5 MG Tabs tablet Generic drug: dapagliflozin propanediol   oseltamivir 75 MG capsule Commonly known as: TAMIFLU       TAKE these medications    albuterol 108 (90 Base) MCG/ACT inhaler Commonly known as: VENTOLIN HFA Inhale 2 puffs into the lungs every 4 (four) hours as needed for wheezing or shortness of breath.   aspirin 81 MG chewable tablet Chew 1 tablet (81 mg total) by mouth daily.   atorvastatin 80 MG tablet Commonly known as: LIPITOR Take 1 tablet (80 mg total) by mouth daily. What changed:  medication strength how much to take   BD Insulin Syringe U/F 30G X 1/2" 0.5 ML Misc Generic drug: Insulin Syringe-Needle U-100 1 SYRINGE THREE TIMES A DAY AS NEEDED   cetirizine 10 MG tablet Commonly known as: ZYRTEC Take 10 mg by mouth daily as needed for allergies.   D3-50 1.25 MG (50000 UT) capsule Generic drug: Cholecalciferol Take 50,000 Units by mouth every three (3) days as needed.   empagliflozin 10 MG Tabs tablet Commonly known as: JARDIANCE Take 1 tablet (10 mg  total) by mouth daily.   glucose blood test strip Commonly known as: ONE TOUCH ULTRA TEST Use as instructed   hydrocortisone 2.5 % cream Apply 1 Application topically 2 (two) times daily as needed.   levothyroxine 137 MCG tablet Commonly known as: SYNTHROID Take 137 mcg by mouth daily.   metFORMIN 1000 MG tablet Commonly known as: GLUCOPHAGE Take 1 tablet (1,000 mg total) by mouth 2 (two) times daily with a meal. What changed: when to take this   metoprolol tartrate 25 MG tablet Commonly known as: LOPRESSOR Take 1 tablet (25 mg total) by mouth 2 (two) times daily.   Mounjaro 10 MG/0.5ML Pen Generic  drug: tirzepatide Inject 10 mg into the skin once a week.   nitroGLYCERIN 0.4 MG SL tablet Commonly known as: Nitrostat Place 1 tablet (0.4 mg total) under the tongue every 5 (five) minutes as needed for chest pain.   NovoLOG 100 UNIT/ML injection Generic drug: insulin aspart Inject 10-20 Units into the skin 3 (three) times daily with meals.   prasugrel 10 MG Tabs tablet Commonly known as: EFFIENT Take 1 tablet (10 mg total) by mouth daily.   VITAMIN E COMPLETE PO Take 1 capsule by mouth in the morning.         Duration of Discharge Encounter: APP Time: 30 minutes   Signed, Olena Leatherwood, PA-C 05/05/2023, 8:09 AM   ATTENDING ATTESTATION:  After conducting a review of all available clinical information with the care team, interviewing the patient, and performing a physical exam, I agree with the findings and plan described in this note.   GEN: No acute distress.   HEENT:  MMM, no JVD, no scleral icterus Cardiac: RRR, no murmurs, rubs, or gallops.  Respiratory: Clear to auscultation bilaterally. GI: Soft, nontender, non-distended  MS: No edema; No deformity. Neuro:  Nonfocal  Vasc:  R radial CDI  Patient doing well today after uncomplicated staged PCI of LAD with DES x 2 yesterday.  No acute events overnight.  1.  Acute coronary syndrome/inferior STEMI:   PCI Lcx/RCA 3/18 and PCI LAD 3/19.  Continue prasugrel 10mg  qday, aspirin 81 mg, atorvastatin 80 mg and metoprolol 25 mg twice daily.  Echocardiogram with preserved LV function with mild inferolateral HK, EF 50-55%.  D/C today with cardiology follow up. 2.  NSVT: Resolved, continue metoprolol 25mg  BID 3.  Hyperlipidemia: Continue high-dose atorvastatin 4.  Type 2 diabetes mellitus: Restart home [place].   APP discharge time:30  MD discharge time:35  Alverda Skeans, MD Pager [phone]

## 2023-05-04 NOTE — Progress Notes (Signed)
 Returned to unit on bed. Connected to bedside monitor. R radial TR band with 12cc of air. Clean, dry, intact.

## 2023-05-04 NOTE — TOC Initial Note (Addendum)
 Transition of Care [facility]) - Initial/Assessment Note    Patient Details  Name: Ryan Decker MRN: [id number] Date of Birth: [birth year]  [facility] CM/SW Contact:    Elliot Cousin, RN Phone Number: [phone] 05/04/2023, 9:21 AM  Clinical Narrative:                  TOC CM spoke to pt and wife at bedside. States he was independent pta. States he does not need note for work. Drives to his appts. Gave permission to speak to wife in case of an emergency.    Benefits check completed for [place] [address] and [place], copay $50. He will need copay cards, pharmacist will provide.  Expected Discharge Plan: Home/Self Care Barriers to Discharge: Continued Medical Work up   Patient Goals and CMS Choice Patient states their goals for this hospitalization and ongoing recovery are:: wants to remain independent          Expected Discharge Plan and Services   Discharge Planning Services: CM Consult   Living arrangements for the past 2 months: Single Family Home                                      Prior Living Arrangements/Services Living arrangements for the past 2 months: Single Family Home Lives with:: Spouse Patient language and need for interpreter reviewed:: Yes        Need for Family Participation in Patient Care: No (Comment) Care giver support system in place?: No (comment)   Criminal Activity/Legal Involvement Pertinent to Current Situation/Hospitalization: No - Comment as needed  Activities of Daily Living   ADL Screening (condition at time of admission) Independently performs ADLs?: Yes (appropriate for developmental age) Is the patient deaf or have difficulty hearing?: No Does the patient have difficulty seeing, even when wearing glasses/contacts?: No Does the patient have difficulty concentrating, remembering, or making decisions?: No  Permission Sought/Granted Permission sought to share information with : Case Manager, Family  Supports, PCP Permission granted to share information with : Yes, Verbal Permission Granted              Emotional Assessment Appearance:: Appears stated age Attitude/Demeanor/Rapport: Engaged Affect (typically observed): Accepting Orientation: : Oriented to Self, Oriented to Place, Oriented to  Time, Oriented to Situation   Psych Involvement: No (comment)  Admission diagnosis:  Acute ST elevation myocardial infarction (STEMI) of inferior wall (HCC) [I21.19] Patient Active Problem List   Diagnosis Date Noted   Acute ST elevation myocardial infarction (STEMI) of inferior wall (HCC) 05/03/2023   Eczema 01/28/2017   Hypothyroidism 12/09/2006   Diabetes mellitus without complication (HCC) 12/09/2006   HYPERLIPIDEMIA 12/09/2006   ASTHMA 12/09/2006   GERD 12/09/2006   PCP:  Farris Has, MD Pharmacy:   [facility] [address] [place] - [address] [place] [address] Phone: [phone] Fax: [phone]  [id number] - [place], [place] - [address] [facility] [address] [place] [address] Phone: [phone] Fax: [phone]  [id number] - [address], [place] - [address] [place] [place]. [place] [address] Phone: [phone] Fax: [phone]     Social Drivers of Health (SDOH) Social History: SDOH Screenings   Food Insecurity: No Food Insecurity (05/03/2023)  Housing: Low Risk  (05/03/2023)  Transportation Needs: No Transportation Needs (05/03/2023)  Utilities:  Not At Risk (05/03/2023)  Tobacco Use: Low Risk  (05/03/2023)   SDOH Interventions:     Readmission Risk Interventions     No data to display

## 2023-05-04 NOTE — H&P (View-Only) (Signed)
   Patient Name: Ryan Decker Date of Encounter: 05/04/2023 [facility] Cardiologist: Yates Decamp, MD   Interval Summary  .    Left flank pain last night >> EKG negative  Ticagrelor changed to Prasugrel given dyspnea.  No acute events overnight, feels well, slept well  Staged LAD PCI today.    Vital Signs .    Vitals:   05/04/23 0400 05/04/23 0430 05/04/23 0500 05/04/23 0700  BP: 97/60 93/71 (!) 89/61   Pulse: 88 81 81   Resp: 14 (!) 7 19   Temp:    98.6 F (37 C)  TempSrc:    Oral  SpO2: 96% 96% 97%   Weight:      Height:        Intake/Output Summary (Last 24 hours) at 05/04/2023 0800 Last data filed at 05/03/2023 2208 Gross per 24 hour  Intake 903 ml  Output 1100 ml  Net -197 ml      05/03/2023    2:44 AM 05/02/2023    9:34 PM 12/10/2021   11:00 AM  Last 3 Weights  Weight (lbs) 189 lb 9.5 oz 190 lb 195 lb  Weight (kg) 86 kg 86.183 kg 88.451 kg      Telemetry/ECG    AIVR yesterday 12:30, OTW SR - Personally Reviewed  Physical Exam .   GEN: No acute distress.   Neck: No JVD Cardiac: RRR, no murmurs, rubs, or gallops.  Respiratory: Clear to auscultation bilaterally. GI: Soft, nontender, non-distended  MS: No edema VASC:  R radial intact  Assessment & Plan .     1.  Acute coronary syndrome: Will continue prasugrel 10mg  qday, aspirin 81 mg, atorvastatin 80 mg and metoprolol 25 mg twice daily.  Echocardiogram with preserved LV function with mild inferolateral HK, EF 50-55%.  staged PCI of LAD today and possible discharge after PCI.  2.  NSVT: Continue beta-blocker; keep K greater than 4, mag greater than 2. 3.  Hyperlipidemia: Continue high-dose atorvastatin 4.  Type 2 diabetes mellitus: Start Jardiance 10mg  Qday tomorrow.  For questions or updates, please contact  [facility] Please consult [email or web address] for contact info under        Signed, Orbie Pyo, MD

## 2023-05-04 NOTE — Interval H&P Note (Signed)
 History and Physical Interval Note:  05/04/2023 3:09 PM  Ryan Decker  has presented today for surgery, with the diagnosis of CAD.  The various methods of treatment have been discussed with the patient and family. After consideration of risks, benefits and other options for treatment, the patient has consented to  Procedure(s): CORONARY STENT INTERVENTION (N/A) as a surgical intervention.  The patient's history has been reviewed, patient examined, no change in status, stable for surgery.  I have reviewed the patient's chart and labs.  Questions were answered to the patient's satisfaction.     Bryan Lemma

## 2023-05-04 NOTE — Progress Notes (Signed)
 CARDIAC REHAB PHASE I    Post MI/stent education including restrictions, risk factors, exercise guidelines, antiplatelet therapy importance, MI booklet, NTG use, heart healthy diabetic diet and CRP2 reviewed. All questions and concerns addressed. Will refer to [facility] for CRP2. Plan for return to cath lab today. Possible discharge home after.   [id number] Woodroe Chen, RN BSN 05/04/2023 11:26 AM

## 2023-05-04 NOTE — Discharge Instructions (Signed)
 Information about your medication: Effient (anti-platelet agent)  Generic Name (Brand): prasugrel (Effient), once daily medication  PURPOSE: You are taking this medication along with aspirin to lower your chance of having a heart attack, stroke, or blood clots in your heart stent. These can be fatal. Effient and aspirin help prevent platelets from sticking together and forming a clot that can block an artery or your stent.   Common SIDE EFFECTS you may experience include: bruising or bleeding more easily, shortness of breath  Do not stop taking EFFIENT without talking to the doctor who prescribes it for you. People who are treated with a stent and stop taking Effient too soon, have a higher risk of getting a blood clot in the stent, having a heart attack, or dying. If you stop Effient because of bleeding, or for other reasons, your risk of a heart attack or stroke may increase.   Avoid taking NSAID agents or anti-inflammatory medications such as ibuprofen, naproxen given increased bleed risk with Effient - can use acetaminophen (Tylenol) if needed for pain.  Tell all of your doctors and dentists that you are taking Effient. They should talk to the doctor who prescribed Effient for you before you have any surgery or invasive procedure.   Contact your health care provider if you experience: severe or uncontrollable bleeding, pink/red/Murry urine, vomiting blood or vomit that looks like "coffee grounds", red or black stools (looks like tar), coughing up blood or blood clots ----------------------------------------------------------------------------------------------------------------------

## 2023-05-04 NOTE — Progress Notes (Signed)
   05/04/23 1222  Spiritual Encounters  Type of Visit Initial  Care provided to: Pt and family  Reason for visit Advance directives   Chaplain responded to Spiritual Consult for Advance Care Directive.  Chaplain arrived in room and delivered ACD education to Pt and wife. Instructed Pt how to reach out to Spiritual Care if they have any questions and to contact us when they are ready to move forward.  Chaplain services remain available by Spiritual Consult or for emergent cases, paging [phone]  Chaplain Raelene Bott, MDiv [email or web address] [phone]

## 2023-05-05 ENCOUNTER — Other Ambulatory Visit (HOSPITAL_COMMUNITY): Payer: Self-pay

## 2023-05-05 ENCOUNTER — Encounter (HOSPITAL_COMMUNITY): Payer: Self-pay | Admitting: Cardiology

## 2023-05-05 LAB — BASIC METABOLIC PANEL
Anion gap: 8 (ref 5–15)
BUN: 14 mg/dL (ref 6–20)
CO2: 23 mmol/L (ref 22–32)
Calcium: 8.6 mg/dL — ABNORMAL LOW (ref 8.9–10.3)
Chloride: 107 mmol/L (ref 98–111)
Creatinine, Ser: 0.7 mg/dL (ref 0.61–1.24)
GFR, Estimated: >60 mL/min (ref >60)
Glucose, Bld: 142 mg/dL — ABNORMAL HIGH (ref 70–99)
Potassium: 3.8 mmol/L (ref 3.5–5.1)
Sodium: 138 mmol/L (ref 135–145)

## 2023-05-05 LAB — CBC
HCT: 38.8 % — ABNORMAL LOW (ref 39.0–52.0)
Hemoglobin: 13.3 g/dL (ref 13.0–17.0)
MCH: 31.1 pg (ref 26.0–34.0)
MCHC: 34.3 g/dL (ref 30.0–36.0)
MCV: 90.7 fL (ref 80.0–100.0)
Platelets: 253 10*3/uL (ref 150–400)
RBC: 4.28 MIL/uL (ref 4.22–5.81)
RDW: 12.5 % (ref 11.5–15.5)
WBC: 8.6 10*3/uL (ref 4.0–10.5)
nRBC: 0 % (ref 0.0–0.2)

## 2023-05-05 LAB — GLUCOSE, CAPILLARY: Glucose-Capillary: 203 mg/dL — ABNORMAL HIGH (ref 70–99)

## 2023-05-05 MED ORDER — PRASUGREL HCL 10 MG PO TABS
10.0000 mg | ORAL_TABLET | Freq: Every day | ORAL | 3 refills | Status: DC
  Filled 2023-05-05: qty 30, 30d supply, fill #0

## 2023-05-05 MED ORDER — FARXIGA 10 MG PO TABS
10.0000 mg | ORAL_TABLET | Freq: Every morning | ORAL | 3 refills | Status: DC
  Filled 2023-05-05: qty 30, 30d supply, fill #0

## 2023-05-05 MED ORDER — ASPIRIN 81 MG PO CHEW
81.0000 mg | CHEWABLE_TABLET | Freq: Every day | ORAL | 3 refills | Status: AC
  Filled 2023-05-05: qty 90, 90d supply, fill #0

## 2023-05-05 MED ORDER — EMPAGLIFLOZIN 10 MG PO TABS
10.0000 mg | ORAL_TABLET | Freq: Every day | ORAL | 3 refills | Status: DC
Start: 2023-05-05 — End: 2023-05-05
  Filled 2023-05-05: qty 30, 30d supply, fill #0

## 2023-05-05 MED ORDER — ATORVASTATIN CALCIUM 80 MG PO TABS
80.0000 mg | ORAL_TABLET | Freq: Every day | ORAL | 3 refills | Status: AC
  Filled 2023-05-05: qty 30, 30d supply, fill #0

## 2023-05-05 MED ORDER — METOPROLOL TARTRATE 25 MG PO TABS
25.0000 mg | ORAL_TABLET | Freq: Two times a day (BID) | ORAL | 3 refills | Status: DC
  Filled 2023-05-05: qty 60, 30d supply, fill #0

## 2023-05-05 MED ORDER — NITROGLYCERIN 0.4 MG SL SUBL
0.4000 mg | SUBLINGUAL_TABLET | SUBLINGUAL | 2 refills | Status: AC | PRN
  Filled 2023-05-05: qty 25, 8d supply, fill #0

## 2023-05-05 MED ORDER — POTASSIUM CHLORIDE CRYS ER 20 MEQ PO TBCR
20.0000 meq | EXTENDED_RELEASE_TABLET | Freq: Once | ORAL | Status: AC
  Administered 2023-05-05: 20 meq via ORAL
  Filled 2023-05-05: qty 1

## 2023-05-05 MED FILL — Nitroglycerin IV Soln 100 MCG/ML in D5W: INTRA_ARTERIAL | Qty: 10 | Status: AC

## 2023-05-05 NOTE — Progress Notes (Signed)
   05/05/23 1038  Spiritual Encounters  Type of Visit Follow up  Care provided to: Pt and family  Conversation partners present during encounter Nurse  Reason for visit Advance directives   Reason for Visit: Chaplain responding to page from RN that patient was ready to have ACD paperwork notarized.  Time of Visit: 55 Minutes  Description of Visit: Chaplain arrived in room to find Pt and wife present. Chaplain inquired about paperwork and Pt provided prepared packet.  Chaplain reviewed the information with the patient and both he and his wife were ready  to proceed.    Chaplain assembled Notary and 2 volunteer witnesses, who then came to the room and witnessed the Pt sign the document.  Witnesses and Notary signed in appropriate places and gave paperwork to chaplain.   Chaplain scanned and faxed paperwork to appropriate address and made 3 copies. The original was returned to Pt.  One copy placed in Pt's paper chart, and 2 copies provided to Pt for the assigned proxies.  Plan of Care: No further chaplain services needed unless called.  Chaplain services remain available by Spiritual Consult or for emergent cases, paging [phone]  Chaplain Raelene Bott, MDiv [email or web address] [phone]

## 2023-05-05 NOTE — Progress Notes (Addendum)
   Patient Name: Izaia Say Date of Encounter: 05/05/2023 [facility] Cardiologist: Yates Decamp, MD   Interval Summary  .    PCI LAD DES x 2 yesterday without issues.  No acute events overnight.  Doing well this AM    Vital Signs .    Vitals:   05/05/23 0500 05/05/23 0530 05/05/23 0600 05/05/23 0630  BP: 99/67 108/74 95/72 91/63   Pulse: 87 88 89 87  Resp:      Temp:      TempSrc:      SpO2: 97% 96% 94% 95%  Weight:      Height:        Intake/Output Summary (Last 24 hours) at 05/05/2023 0752 Last data filed at 05/04/2023 2235 Gross per 24 hour  Intake 2080.05 ml  Output 575 ml  Net 1505.05 ml      05/03/2023    2:44 AM 05/02/2023    9:34 PM 12/10/2021   11:00 AM  Last 3 Weights  Weight (lbs) 189 lb 9.5 oz 190 lb 195 lb  Weight (kg) 86 kg 86.183 kg 88.451 kg      Telemetry/ECG    SR - Personally Reviewed  Physical Exam .   GEN: No acute distress.   Neck: No JVD Cardiac: RRR, no murmurs, rubs, or gallops.  Respiratory: Clear to auscultation bilaterally. GI: Soft, nontender, non-distended  MS: No edema VASC:  R radial intact  Assessment & Plan .     1.  Acute coronary syndrome/inferior STEMI:  PCI Lcx/RCA 3/18 and PCI LAD 3/19.  Continue prasugrel 10mg  qday, aspirin 81 mg, atorvastatin 80 mg and metoprolol 25 mg twice daily.  Echocardiogram with preserved LV function with mild inferolateral HK, EF 50-55%.  D/C today with cardiology follow up. 2.  NSVT: Resolved, continue metoprolol 25mg  BID 3.  Hyperlipidemia: Continue high-dose atorvastatin 4.  Type 2 diabetes mellitus: Restart home [place].  For questions or updates, please contact [facility] Please consult [email or web address] for contact info under        Signed, Orbie Pyo, MD  Patient ID: Lauretta Grill, male   DOB: 12-26-1965, 58 y.o.   MRN: [id number]

## 2023-05-05 NOTE — Progress Notes (Signed)
 Reviewed AVS, patient expressed understanding of medications, MD follow up reviewed.   Patient states all belongings brought to the hospital at time of admission are accounted for and packed to take home.  Picked up medications from [facility] pharmacy. Pt transported to entrance A where family member was waiting in vehicle to transport home.

## 2023-05-05 NOTE — Progress Notes (Signed)
 CARDIAC REHAB PHASE I    Post MI/stent education completed. Referral to CRP2 sent to [facility]. All questions and concerns addressed. Discharge home today.   Sofie Rower RN BSN

## 2023-05-09 ENCOUNTER — Encounter: Payer: Self-pay | Admitting: Physician Assistant

## 2023-05-09 DIAGNOSIS — I2510 Atherosclerotic heart disease of native coronary artery without angina pectoris: Secondary | ICD-10-CM

## 2023-05-09 HISTORY — DX: Atherosclerotic heart disease of native coronary artery without angina pectoris: I25.10

## 2023-05-09 NOTE — Progress Notes (Signed)
 "     Cardiology Office Note:    Date:  05/10/2023  ID:  Ryan Decker, DOB 01-10-1966, MRN [id number] PCP: Remonia Alm PARAS, MD  [facility] Providers Cardiologist:  Gordy Bergamo, MD       Patient Profile:      Coronary artery disease  Inferior STEMI in 04/2023: s/p unsuccessful attempt to cross LCx CTO; S/p 3 x 28 mm and 3 x 12 mm overlapping DES to RCA LHC 05/02/23: LAD proximal 99, mid 60, distal 80, 80, 95, D1 60; LCx 100 CTO with L-L collaterals; RCA proximal 99, mid 70, distal 85, RPDA 99, RPAV 99 S/p staged PCI 05/04/2023: 2.75 x 34 mm DES to the proximal LAD, 2 x 15 mm DES to the distal LAD TTE 05/03/2023: Inferior/inferolateral HK, EF 50-55, mild LVH, GR 1 DD, normal RVSF Hyperlipidemia   LP(a) <8.4 Diabetes mellitus Hypothyroidism Asthma        Discussed the use of AI scribe software for clinical note transcription with the patient, who gave verbal consent to proceed.  History of Present Illness Ryan Decker is a 58 y.o. male who returns for post hospital follow up.  He was admitted 3/17-3/20 with an inferior STEMI.  Emergent cardiac catheterization demonstrated severe diffuse three-vessel CAD.  LCx was chronically occluded.  PCI attempted this was unsuccessful.  Patient underwent PCI with overlapping DES x 2 to the RCA.  He had residual high-grade disease in the LAD.  He returned to the Cath Lab 3/19 for staged PCI with DES to the proximal LAD and DES to the distal LAD.  EF was normal by echocardiogram.  Post PCI he had symptoms of shortness of breath and Brilinta  was switched to Effient .  He did have NSVT and was placed on metoprolol  25 mg twice daily.  He has experienced intermittent chest pain since discharge, described as short pains in different spots.  The chest pain experienced during the heart attack has not recurred with the same intensity. No chest heaviness, pressure, or unusual shortness of breath during physical activities. He reports a strange incident the  previous night where he felt extremely cold, shivering despite wearing multiple layers, followed by a period of feeling very hot and sweating on the left side of his body. He did not check his temperature and denies any fever, body aches, cough, congestion, or sore throat. His physical activity is limited to walking around places like [place] and doing some housework. He has a history of asthma, which contributes to shortness of breath. No syncope or orthopnea.   ROS-See HPI    Studies Reviewed:   EKG Interpretation Date/Time:  Tuesday May 10 2023 08:19:13 EDT Ventricular Rate:  87 PR Interval:  146 QRS Duration:  88 QT Interval:  366 QTC Calculation: 440 R Axis:   -13  Text Interpretation: Normal sinus rhythm Inferior infarct , age undetermined T wave inversion less evident in Inferior leads Confirmed by Lelon Hamilton [phone]) on 05/10/2023 8:24:12 AM    Results    Risk Assessment/Calculations:             Physical Exam:   VS:  BP 110/70   Pulse 87   Ht 5' 8 (1.727 m)   Wt 167 lb (75.8 kg)   SpO2 97%   BMI 25.39 kg/m    Wt Readings from Last 3 Encounters:  05/10/23 167 lb (75.8 kg)  05/03/23 189 lb 9.5 oz (86 kg)  12/10/21 195 lb (88.5 kg)    Constitutional:  Appearance: Healthy appearance. Not in distress.  Neck:     Vascular: JVD normal.  Pulmonary:     Breath sounds: No wheezing. No rales.  Cardiovascular:     Normal rate. Regular rhythm.     Murmurs: There is no murmur.     Comments: R wrist without hematoma Edema:    Peripheral edema absent.  Abdominal:     Palpations: Abdomen is soft.        Assessment and Plan:   Assessment & Plan Acute ST elevation myocardial infarction (STEMI) of inferior wall (HCC) S/p recent inferior STEMI treated with overlapping drug-eluting stents (DES) to the right coronary artery (RCA), unsuccessful attempt to cross the left circumflex chronic total occlusion.  He underwent staged intervention with DES to the proximal  and distal left anterior descending artery (LAD). Ejection fraction (EF) is 50-55% with inferior and inferolateral hypokinesis.  He has had some intermittent chest discomfort since discharge which is noncardiac.  I have encouraged him to pursue cardiac rehabilitation for improved long-term outcomes.  We discussed the importance of dual antiplatelet therapy for minimum of 1 year.   - Continue aspirin  81 mg daily. - Continue prasugrel  10 mg daily - Continue metoprolol  tartrate 25 mg twice daily. - Continue Lipitor 80 mg daily. - Continue nitroglycerin  as needed for chest pain - Start cardiac rehabilitation  Pure hypercholesterolemia On Lipitor 80 mg daily for cholesterol management. Emphasized maintaining low LDL levels (<55 mg/dL) to prevent future cardiovascular events.  - Continue Lipitor 80 mg daily. - Schedule follow-up blood work in 6-8 weeks to assess cholesterol levels.    Cardiac Rehabilitation Eligibility Assessment  The patient is ready to start cardiac rehabilitation from a cardiac standpoint.     Dispo:  Return in about 3 months (around 08/10/2023) for Routine Follow Up, w/ Dr. Ladona.  Signed, Glendia Ferrier, PA-C   "

## 2023-05-10 ENCOUNTER — Other Ambulatory Visit: Payer: Self-pay | Admitting: *Deleted

## 2023-05-10 ENCOUNTER — Ambulatory Visit: Attending: Physician Assistant | Admitting: Physician Assistant

## 2023-05-10 ENCOUNTER — Encounter: Payer: Self-pay | Admitting: Physician Assistant

## 2023-05-10 VITALS — BP 110/70 | HR 87 | Ht 68.0 in | Wt 167.0 lb

## 2023-05-10 DIAGNOSIS — I2119 ST elevation (STEMI) myocardial infarction involving other coronary artery of inferior wall: Secondary | ICD-10-CM | POA: Diagnosis not present

## 2023-05-10 DIAGNOSIS — E7800 Pure hypercholesterolemia, unspecified: Secondary | ICD-10-CM

## 2023-05-10 NOTE — Assessment & Plan Note (Signed)
 On Lipitor 80 mg daily for cholesterol management. Emphasized maintaining low LDL levels (<55 mg/dL) to prevent future cardiovascular events.  - Continue Lipitor 80 mg daily. - Schedule follow-up blood work in 6-8 weeks to assess cholesterol levels.

## 2023-05-10 NOTE — Assessment & Plan Note (Signed)
 S/p recent inferior STEMI treated with overlapping drug-eluting stents (DES) to the right coronary artery (RCA), unsuccessful attempt to cross the left circumflex chronic total occlusion.  He underwent staged intervention with DES to the proximal and distal left anterior descending artery (LAD). Ejection fraction (EF) is 50-55% with inferior and inferolateral hypokinesis.  He has had some intermittent chest discomfort since discharge which is noncardiac.  I have encouraged him to pursue cardiac rehabilitation for improved long-term outcomes.  We discussed the importance of dual antiplatelet therapy for minimum of 1 year.   - Continue aspirin 81 mg daily. - Continue prasugrel 10 mg daily - Continue metoprolol tartrate 25 mg twice daily. - Continue Lipitor 80 mg daily. - Continue nitroglycerin as needed for chest pain - Start cardiac rehabilitation

## 2023-05-10 NOTE — Patient Instructions (Signed)
 Medication Instructions:  Your physician recommends that you continue on your current medications as directed. Please refer to the Current Medication list given to you today.  *If you need a refill on your cardiac medications before your next appointment, please call your pharmacy*   Lab Work: WHEN YOU SEE DR. KERR IN 2 MONTHS, MAKE SURE THEY DRAW A CMET & LIPID.  YOU HAVE TO ORDERS TO TAKE WITH YOU  If you have labs (blood work) drawn today and your tests are completely normal, you will receive your results only by: MyChart Message (if you have MyChart) OR A paper copy in the mail If you have any lab test that is abnormal or we need to change your treatment, we will call you to review the results.   Testing/Procedures: None ordered   Follow-Up: At [facility], you and your health needs are our priority.  As part of our continuing mission to provide you with exceptional heart care, we have created designated Provider Care Teams.  These Care Teams include your primary Cardiologist (physician) and Advanced Practice Providers (APPs -  Physician Assistants and Nurse Practitioners) who all work together to provide you with the care you need, when you need it.  We recommend signing up for the patient portal called "MyChart".  Sign up information is provided on this After Visit Summary.  MyChart is used to connect with patients for Virtual Visits (Telemedicine).  Patients are able to view lab/test results, encounter notes, upcoming appointments, etc.  Non-urgent messages can be sent to your provider as well.   To learn more about what you can do with MyChart, go to [email or web address].    Your next appointment:   3 month(s)  Provider:   Yates Decamp, MD     Other Instructions     1st Floor: - Lobby - Registration  - Pharmacy  - Lab - Cafe  2nd Floor: - PV Lab - Diagnostic Testing (echo, CT, nuclear med)  3rd Floor: - Vacant  4th Floor: - TCTS (cardiothoracic  surgery) - AFib Clinic - Structural Heart Clinic - Vascular Surgery  - Vascular Ultrasound  5th Floor: - [facility] Cardiology (general and EP) - Clinical Pharmacy for coumadin, hypertension, lipid, weight-loss medications, and med management appointments    Valet parking services will be available as well.

## 2023-05-11 ENCOUNTER — Telehealth: Payer: Self-pay | Admitting: Cardiology

## 2023-05-11 DIAGNOSIS — Z0279 Encounter for issue of other medical certificate: Secondary | ICD-10-CM

## 2023-05-11 NOTE — Telephone Encounter (Signed)
 Paper Work Dropped Off: [employer] -Patient is needing to return to work Monday 05/16/2023 due to being out five days.   Date: 05/11/2023  Location of paper: [place] ([employer]) -FMLA

## 2023-05-11 NOTE — Telephone Encounter (Signed)
 Pt calling to make sure that he wrote down correct contact # which is the # in his chart

## 2023-05-12 ENCOUNTER — Telehealth (HOSPITAL_COMMUNITY): Payer: Self-pay

## 2023-05-12 NOTE — Telephone Encounter (Signed)
 Pt insurance is active and benefits verified through [facility]. Co-pay $30.00, DED $500.00/$242.41 met, out of pocket $3,000.00/$341.50 met, co-insurance 0%. No pre-authorization required. Passport, 05/12/23 @ 3:49PM, REF#[id number]   How many CR sessions are covered? (36 visits for TCR, 72 visits for ICR)72 Is this a lifetime maximum or an annual maximum? Annual Has the member used any of these services to date? No Is there a time limit (weeks/months) on start of program and/or program completion? No     Will contact patient to see if he is interested in the Cardiac Rehab Program. If interested, patient will need to complete follow up appt. Once completed, patient will be contacted for scheduling upon review by the RN Navigator.

## 2023-05-12 NOTE — Telephone Encounter (Signed)
 Patient brought in a [employer] disability form.  He paid $29 form fee and signed the ROI.  Form in Dr. Verl Dicker box.

## 2023-05-12 NOTE — Telephone Encounter (Signed)
 Called and spoke with pt in regards to CR, pt stated he is not interested at this time due to his copay and he has access to workout equipment at his job.   Closed referral

## 2023-05-13 NOTE — Telephone Encounter (Signed)
 [employer] form faxed to insurance and scanned into chart. Billing notified.

## 2023-05-26 ENCOUNTER — Emergency Department (HOSPITAL_COMMUNITY)
Admission: EM | Admit: 2023-05-26 | Discharge: 2023-05-27 | Disposition: A | Discharge status: Home / Self Care | Attending: Emergency Medicine | Admitting: Emergency Medicine

## 2023-05-26 ENCOUNTER — Emergency Department (HOSPITAL_COMMUNITY)

## 2023-05-26 ENCOUNTER — Other Ambulatory Visit: Payer: Self-pay

## 2023-05-26 DIAGNOSIS — Z794 Long term (current) use of insulin: Secondary | ICD-10-CM | POA: Insufficient documentation

## 2023-05-26 DIAGNOSIS — Z91010 Allergy to peanuts: Secondary | ICD-10-CM | POA: Diagnosis not present

## 2023-05-26 DIAGNOSIS — M7989 Other specified soft tissue disorders: Secondary | ICD-10-CM

## 2023-05-26 DIAGNOSIS — Z7982 Long term (current) use of aspirin: Secondary | ICD-10-CM | POA: Diagnosis not present

## 2023-05-26 DIAGNOSIS — R2232 Localized swelling, mass and lump, left upper limb: Secondary | ICD-10-CM | POA: Insufficient documentation

## 2023-05-26 LAB — CBC WITH DIFFERENTIAL/PLATELET
Abs Immature Granulocytes: 0.01 10*3/uL (ref 0.00–0.07)
Basophils Absolute: 0.1 10*3/uL (ref 0.0–0.1)
Basophils Relative: 1 %
Eosinophils Absolute: 0.3 10*3/uL (ref 0.0–0.5)
Eosinophils Relative: 4 %
HCT: 43.5 % (ref 39.0–52.0)
Hemoglobin: 14.7 g/dL (ref 13.0–17.0)
Immature Granulocytes: 0 %
Lymphocytes Relative: 32 %
Lymphs Abs: 2 10*3/uL (ref 0.7–4.0)
MCH: 30.6 pg (ref 26.0–34.0)
MCHC: 33.8 g/dL (ref 30.0–36.0)
MCV: 90.6 fL (ref 80.0–100.0)
Monocytes Absolute: 0.6 10*3/uL (ref 0.1–1.0)
Monocytes Relative: 10 %
Neutro Abs: 3.4 10*3/uL (ref 1.7–7.7)
Neutrophils Relative %: 53 %
Platelets: 224 10*3/uL (ref 150–400)
RBC: 4.8 MIL/uL (ref 4.22–5.81)
RDW: 12.2 % (ref 11.5–15.5)
WBC: 6.3 10*3/uL (ref 4.0–10.5)
nRBC: 0 % (ref 0.0–0.2)

## 2023-05-26 LAB — COMPREHENSIVE METABOLIC PANEL WITH GFR
ALT: 37 U/L (ref 0–44)
AST: 42 U/L — ABNORMAL HIGH (ref 15–41)
Albumin: 4 g/dL (ref 3.5–5.0)
Alkaline Phosphatase: 44 U/L (ref 38–126)
Anion gap: 10 (ref 5–15)
BUN: 16 mg/dL (ref 6–20)
CO2: 22 mmol/L (ref 22–32)
Calcium: 9.9 mg/dL (ref 8.9–10.3)
Chloride: 107 mmol/L (ref 98–111)
Creatinine, Ser: 0.82 mg/dL (ref 0.61–1.24)
GFR, Estimated: >60 mL/min (ref >60)
Glucose, Bld: 116 mg/dL — ABNORMAL HIGH (ref 70–99)
Potassium: 4.1 mmol/L (ref 3.5–5.1)
Sodium: 139 mmol/L (ref 135–145)
Total Bilirubin: 0.8 mg/dL (ref 0.0–1.2)
Total Protein: 6.9 g/dL (ref 6.5–8.1)

## 2023-05-26 NOTE — ED Triage Notes (Signed)
 Patient reports left lateral elbow swelling " twinge" this evening , denies injury /no heavy lifting .

## 2023-05-26 NOTE — ED Provider Triage Note (Signed)
 Emergency Medicine Provider Triage Evaluation Note  Ryan Decker , a 58 y.o. male  was evaluated in triage.  Pt complains of elbow swelling. States he noted left elbow swelling this afternoon while driving home. Some pain with movement. Denies any recent falls, injury, or trauma. No insect bites. History of olecranon bursitis several years ago.  Review of Systems  Positive: As above Negative: As above  Physical Exam  BP 132/84 (BP Location: Right Arm)   Pulse 90   Temp 97.7 F (36.5 C)   Resp 16   SpO2 100%  Gen:   Awake, no distress   Resp:  Normal effort  MSK:   Moves extremities without difficulty, swelling the lateral elbow, primarily involving the muscles and no swelling or tenderness at the olecranon itself. Other:    Medical Decision Making  Medically screening exam initiated at 9:06 PM.  Appropriate orders placed.  Lauretta Grill was informed that the remainder of the evaluation will be completed by another provider, this initial triage assessment does not replace that evaluation, and the importance of remaining in the ED until their evaluation is complete.     Smitty Knudsen, PA-C 05/26/23 2107

## 2023-05-27 ENCOUNTER — Other Ambulatory Visit (HOSPITAL_COMMUNITY): Payer: Self-pay | Admitting: Emergency Medicine

## 2023-05-27 ENCOUNTER — Emergency Department (HOSPITAL_BASED_OUTPATIENT_CLINIC_OR_DEPARTMENT_OTHER)
Admission: RE | Admit: 2023-05-27 | Discharge: 2023-05-27 | Disposition: A | Discharge status: Home / Self Care | Source: Ambulatory Visit | Attending: Emergency Medicine | Admitting: Emergency Medicine

## 2023-05-27 DIAGNOSIS — M7989 Other specified soft tissue disorders: Secondary | ICD-10-CM

## 2023-05-27 MED ORDER — DICLOFENAC SODIUM 1 % EX GEL: 4.0000 g | Freq: Four times a day (QID) | CUTANEOUS | 0 refills | Status: DC

## 2023-05-27 NOTE — ED Provider Notes (Signed)
 [facility] AT [facility] Provider Note   CSN: [id number] Arrival date & time: 05/26/23  2045     History  Chief Complaint  Patient presents with   Left Elbow Swelling    Ryan Decker is a 58 y.o. male.  58 yo M with a cc of L arm pain and swelling.  Started suddenly, felt a twinge and noticed some swelling.  Mostly about the lateral epicondyle. Recently in hospital for MI started on blood thinning medicines.         Home Medications Prior to Admission medications   Medication Sig Start Date End Date Taking? Authorizing Provider  diclofenac Sodium (VOLTAREN) 1 % GEL Apply 4 g topically 4 (four) times daily. 05/27/23  Yes Melene Plan, DO  albuterol (PROVENTIL HFA;VENTOLIN HFA) 108 (90 Base) MCG/ACT inhaler Inhale 2 puffs into the lungs every 4 (four) hours as needed for wheezing or shortness of breath. 01/28/17   Nelwyn Salisbury, MD  aspirin 81 MG chewable tablet Chew 1 tablet (81 mg total) by mouth daily. 05/05/23   Parcells, Therisa Doyne, PA-C  atorvastatin (LIPITOR) 80 MG tablet Take 1 tablet (80 mg total) by mouth daily. 05/05/23   Parcells, Ladona Ridgel A, PA-C  BD INSULIN SYRINGE U/F 30G X 1/2" 0.5 ML MISC 1 SYRINGE THREE TIMES A DAY AS NEEDED 10/26/21   [provider]  cetirizine (ZYRTEC) 10 MG tablet Take 10 mg by mouth daily as needed for allergies.    [provider]  D3-50 1.25 MG (50000 UT) capsule Take 50,000 Units by mouth every three (3) days as needed. 03/22/23   [provider]  FARXIGA 10 MG TABS tablet Take 1 tablet (10 mg total) by mouth every morning. 05/05/23   Orbie Pyo, MD  glucose blood (ONE TOUCH ULTRA TEST) test strip Use as instructed 11/30/12   Nelwyn Salisbury, MD  hydrocortisone 2.5 % cream Apply 1 Application topically 2 (two) times daily as needed. 02/24/23   [provider]  levothyroxine (SYNTHROID) 125 MCG tablet Take 125 mcg by mouth daily before breakfast.    [provider]   metFORMIN (GLUCOPHAGE) 1000 MG tablet Take 1 tablet (1,000 mg total) by mouth 2 (two) times daily with a meal. 10/28/15   Nelwyn Salisbury, MD  metoprolol tartrate (LOPRESSOR) 25 MG tablet Take 1 tablet (25 mg total) by mouth 2 (two) times daily. 05/05/23   Parcells, Therisa Doyne, PA-C  MOUNJARO 10 MG/0.5ML Pen Inject 10 mg into the skin once a week. 04/29/23   [provider]  nitroGLYCERIN (NITROSTAT) 0.4 MG SL tablet Place 1 tablet (0.4 mg total) under the tongue every 5 (five) minutes as needed for chest pain. 05/05/23 05/04/24  Parcells, Therisa Doyne, PA-C  NOVOLOG 100 UNIT/ML injection Inject 10-20 Units into the skin 3 (three) times daily with meals. 10/29/21   [provider]  prasugrel (EFFIENT) 10 MG TABS tablet Take 1 tablet (10 mg total) by mouth daily. 05/05/23   Parcells, Therisa Doyne, PA-C  Vitamin Mixture (VITAMIN E COMPLETE PO) Take 1 capsule by mouth in the morning.    [provider]      Allergies    Keflex [cephalexin] and Peanut-containing drug products    Review of Systems   Review of Systems  Physical Exam Updated Vital Signs BP 95/66 (BP Location: Left Arm)   Pulse 81   Temp (!) 96.4 F (35.8 C)   Resp 16   SpO2 100%  Physical  Exam Vitals and nursing note reviewed.  Constitutional:      Appearance: He is well-developed.  HENT:     Head: Normocephalic and atraumatic.  Eyes:     Pupils: Pupils are equal, round, and reactive to light.  Neck:     Vascular: No JVD.  Cardiovascular:     Rate and Rhythm: Normal rate and regular rhythm.     Heart sounds: No murmur heard.    No friction rub. No gallop.  Pulmonary:     Effort: No respiratory distress.     Breath sounds: No wheezing.  Abdominal:     General: There is no distension.     Tenderness: There is no abdominal tenderness. There is no guarding or rebound.  Musculoskeletal:        General: Swelling present. Normal range of motion.     Cervical back: Normal range of motion and neck supple.      Comments: Swelling to the left lateral forearm. Spares the [facility].   Skin:    Coloration: Skin is not pale.     Findings: No rash.  Neurological:     Mental Status: He is alert and oriented to person, place, and time.  Psychiatric:        Behavior: Behavior normal.     ED Results / Procedures / Treatments   Labs (all labs ordered are listed, but only abnormal results are displayed) Labs Reviewed  COMPREHENSIVE METABOLIC PANEL WITH GFR - Abnormal; Notable for the following components:      Result Value   Glucose, Bld 116 (*)    AST 42 (*)    All other components within normal limits  CBC WITH DIFFERENTIAL/PLATELET    EKG None  Radiology DG Elbow Complete Left Result Date: 05/26/2023 CLINICAL DATA:  Elbow swelling EXAM: LEFT ELBOW - COMPLETE 3+ VIEW COMPARISON:  None Available. FINDINGS: No fracture or malalignment. No significant elbow effusion. Extensive vascular calcifications. Protuberant soft tissue opacity lateral aspect of the proximal forearm/elbow. IMPRESSION: 1. No acute osseous abnormality. 2. Protuberant soft tissue opacity lateral aspect of the proximal forearm/elbow, either due to mass or fluid collection. Correlation with ultrasound could initially be considered. Electronically Signed   By: Jasmine Pang M.D.   On: 05/26/2023 23:19    Procedures Procedures   Emergency Focused Ultrasound Exam Limited Ultrasound of Soft Tissue   Performed and interpreted by Dr. Adela Lank Indication: evaluation for infection or foreign body Transverse and Sagittal views of left forearm are obtained in real time for the purposes of evaluation of skin and underlying soft tissues.  Findings: no heterogeneous fluid collection, with hyperemia/edema of surrounding tissue Interpretation: no abscess, no cellulitis Images archived electronically.  CPT Codes:   Upper extremity [id number]     Medications Ordered in ED Medications - No data to display  ED Course/ Medical Decision Making/  A&P                                 Medical Decision Making  58 yo M with a chief complaints of pain and swelling to the left forearm.  It appears to be swollen along the musculature overlying the lateral epicondyle.  It is not erythematous it is not warm.  He has some pictures on his phone where there appears to be a small erythematous lesion along the EAC.  I wonder if this could be due to an insect bite or sting.  The other  possibility is he was just started on dual antiplatelet therapy after having stent placement.  He could have a spontaneous hematoma though I do not visualize blood on bedside ultrasound.  Formal ultrasound not available.  Will put an order in for the study to be performed tomorrow.  PCP follow-up.  1:38 AM:  I have discussed the diagnosis/risks/treatment options with the patient and family.  Evaluation and diagnostic testing in the emergency department does not suggest an emergent condition requiring admission or immediate intervention beyond what has been performed at this time.  They will follow up with PCP. We also discussed returning to the ED immediately if new or worsening sx occur. We discussed the sx which are most concerning (e.g., sudden worsening pain, fever, inability to tolerate by mouth) that necessitate immediate return. Medications administered to the patient during their visit and any new prescriptions provided to the patient are listed below.  Medications given during this visit Medications - No data to display   The patient appears reasonably screen and/or stabilized for discharge and I doubt any other medical condition or other [facility] requiring further screening, evaluation, or treatment in the ED at this time prior to discharge.          Final Clinical Impression(s) / ED Diagnoses Final diagnoses:  Left arm swelling    Rx / DC Orders ED Discharge Orders          Ordered    UE VENOUS DUPLEX        05/27/23 0131    diclofenac Sodium (VOLTAREN)  1 % GEL  4 times daily        05/27/23 0132              Melene Plan, DO 05/27/23 0138

## 2023-05-27 NOTE — Discharge Instructions (Addendum)
 Keep an eye on your arm.  If it rapidly swells or if you get a fever then you please let your doctor know.  I did order an ultrasound for you to have done tomorrow if you would like to have it done.  Use the gel Also take tylenol 1000mg (2 extra strength) four times a day.

## 2023-05-31 ENCOUNTER — Other Ambulatory Visit (HOSPITAL_COMMUNITY): Payer: Self-pay

## 2023-06-06 ENCOUNTER — Encounter: Payer: Self-pay | Admitting: Cardiology

## 2023-06-17 ENCOUNTER — Encounter: Payer: Self-pay | Admitting: Cardiology

## 2023-07-25 ENCOUNTER — Encounter: Payer: Self-pay | Admitting: Cardiology

## 2023-07-25 NOTE — Telephone Encounter (Signed)
 I have yet to see him at all. Do not know when his next appointment is?

## 2023-07-26 NOTE — Telephone Encounter (Signed)
 Apologies Dr. Berry Bristol- you see him 6/25, Dr. Addie Holstein placed stent- I can defer to him

## 2023-07-30 NOTE — Telephone Encounter (Signed)
 I think that is an outstanding answer that Start provided.  Could not have said it it better myself. Randene Bustard, MD

## 2023-08-03 NOTE — Progress Notes (Addendum)
 Cardiology Office Note:  .   Date:  08/10/2023  ID:  Ryan Decker, DOB 1965/06/13, MRN [id number] PCP: Remonia Alm PARAS, MD  [facility] Providers Cardiologist:  Gordy Bergamo, MD   History of Present Illness: .   Ryan Decker is a 58 y.o. Caucasian male hypertension, hypercholesterolemia, diabetes mellitus patient with inferior STEMI in March 2025 and underwent angioplasty to RCA and proximal and distal LAD in a staged fashion the following day and has known CTO mid small Cx, echocardiogram on 05/03/2023 revealing inferolateral hypokinesis, EF 55% he presents for a 61-month office visit.  Specifically his LDL being targeted.  Tolerating 80 mg of Lipitor.  Discussed the use of AI scribe software for clinical note transcription with the patient, who gave verbal consent to proceed.  History of Present Illness Ryan Decker is a 58 year old male with coronary artery disease and diabetes who presents with chest pain and shortness of breath. Wifepresent.  He experiences a constant ache in the chest area, either under the ribcage or further up, persisting since previous stent procedures. The sharp pain has subsided, and there is no current sharp chest pain. He is unsure if the pain is related to sleeping position, hiatal hernia, or gastrointestinal issues. He also has dyspnea on exertion but stable without PND or orthopnea.  He is on atorvastatin  80 mg, but LDL levels remain high at 93 mg/dL. He sometimes forgets to take metoprolol  tartrate, leading to a pounding heart sensation. Diabetes management is improving with Farxiga , metformin , Mounjaro, and insulin  as needed. His HbA1c is around 7.0%. He has lost seven pounds recently and is concerned about further weight loss.  Labs   Lab Results  Component Value Date   CHOL 174 05/02/2023   HDL 45 05/02/2023   LDLCALC 93 05/02/2023   LDLDIRECT 167.0 01/08/2015   TRIG 178 (H) 05/02/2023   CHOLHDL 3.9 05/02/2023   Lab Results  Component  Value Date   NA 139 05/26/2023   K 4.1 05/26/2023   CO2 22 05/26/2023   GLUCOSE 116 (H) 05/26/2023   BUN 16 05/26/2023   CREATININE 0.82 05/26/2023   CALCIUM  9.9 05/26/2023   GFR 111.03 01/28/2017   GFRNONAA >60 05/26/2023      Latest Ref Rng & Units 05/26/2023    9:09 PM 05/05/2023    2:23 AM 05/04/2023    3:22 AM  BMP  Glucose 70 - 99 mg/dL 883  857  885   BUN 6 - 20 mg/dL 16  14  13    Creatinine 0.61 - 1.24 mg/dL 9.17  9.29  9.20   Sodium 135 - 145 mmol/L 139  138  139   Potassium 3.5 - 5.1 mmol/L 4.1  3.8  4.1   Chloride 98 - 111 mmol/L 107  107  107   CO2 22 - 32 mmol/L 22  23  26    Calcium  8.9 - 10.3 mg/dL 9.9  8.6  8.8       Latest Ref Rng & Units 05/26/2023    9:09 PM 05/05/2023    2:23 AM 05/04/2023    3:22 AM  CBC  WBC 4.0 - 10.5 K/uL 6.3  8.6  7.7   Hemoglobin 13.0 - 17.0 g/dL 85.2  86.6  86.7   Hematocrit 39.0 - 52.0 % 43.5  38.8  39.4   Platelets 150 - 400 K/uL 224  253  234    Lab Results  Component Value Date   HGBA1C 7.8 (H) 05/02/2023  Lab Results  Component Value Date   TSH 0.056 (L) 05/02/2023    [facility] labs 07/08/2023:  A1c 7.0%.  BUN 16, creatinine 0.820, EGFR 107 mL.  Total cholesterol 105, triglycerides 147, HDL 35, LDL 44.  ROS  Review of Systems  Cardiovascular:  Positive for chest pain (pleuritic left sided) and dyspnea on exertion. Negative for leg swelling, orthopnea and syncope.    Physical Exam:   VS:  BP 100/62 (BP Location: Left Arm, Patient Position: Sitting, Cuff Size: Normal)   Pulse 90   Resp 16   Ht 5' 8 (1.727 m)   Wt 153 lb 3.2 oz (69.5 kg)   SpO2 98%   BMI 23.29 kg/m    Wt Readings from Last 3 Encounters:  08/10/23 153 lb 3.2 oz (69.5 kg)  05/10/23 167 lb (75.8 kg)  05/03/23 189 lb 9.5 oz (86 kg)    Physical Exam Neck:     Vascular: No carotid bruit or JVD.   Cardiovascular:     Rate and Rhythm: Normal rate and regular rhythm.     Pulses: Intact distal pulses.          Dorsalis pedis pulses are 1+ on the  right side and 1+ on the left side.       Posterior tibial pulses are 1+ on the right side and 1+ on the left side.     Heart sounds: Normal heart sounds. No murmur heard.    No gallop.  Pulmonary:     Effort: Pulmonary effort is normal.     Breath sounds: Normal breath sounds.  Abdominal:     General: Bowel sounds are normal.     Palpations: Abdomen is soft.   Musculoskeletal:     Right lower leg: No edema.     Left lower leg: No edema.    Studies Reviewed: SABRA   CARDIAC CATHETERIZATION 05/04/2023         3 x 28 mm and 3 x 12 mm overlapping DES to RCA LHC 05/02/23: LAD proximal 99, mid 60, distal 80, 80, 95, D1 60; LCx 100 CTO with L-L collaterals; RCA proximal 99, mid 70, distal 85, RPDA 99, RPAV 99 S/p staged PCI 05/04/2023: 2.75 x 34 mm DES to the proximal LAD, 2 x 15 mm DES to the distal LAD EKG:         Medications ordered    Meds ordered this encounter  Medications   DISCONTD: Evolocumab (REPATHA SURECLICK) 140 MG/ML SOAJ    Sig: Inject 140 mg into the skin every 14 (fourteen) days.    Dispense:  6 mL    Refill:  3   metoprolol  succinate (TOPROL -XL) 25 MG 24 hr tablet    Sig: Take 1 tablet (25 mg total) by mouth daily. Take with or immediately following a meal.    Dispense:  90 tablet    Refill:  3   losartan (COZAAR) 25 MG tablet    Sig: Take 1 tablet (25 mg total) by mouth daily.    Dispense:  90 tablet    Refill:  3     ASSESSMENT AND PLAN: .      ICD-10-CM   1. Coronary artery disease of native artery of native heart with stable angina pectoris (HCC)  I25.118 metoprolol  succinate (TOPROL -XL) 25 MG 24 hr tablet    losartan (COZAAR) 25 MG tablet    Lipid Profile    Basic Metabolic Panel (BMET)    DISCONTINUED: Evolocumab (REPATHA SURECLICK) 140 MG/ML SOAJ  2. Pure hypercholesterolemia  E78.00 Lipid Profile    DISCONTINUED: Evolocumab (REPATHA SURECLICK) 140 MG/ML SOAJ    3. Type 2 diabetes mellitus with hyperglycemia, without long-term current use of  insulin  (HCC)  E11.65 losartan (COZAAR) 25 MG tablet    Basic Metabolic Panel (BMET)      Assessment & Plan Coronary artery disease with stents Intermittent chest pain, likely musculoskeletal, with a history of stents in the right coronary artery and LAD. The circumflex artery is completely occluded and not amenable to intervention. Heart function is nearly normal with relaxation abnormality due to scar tissue, contributing to shortness of breath. Focus on medical therapy to prevent further cardiac events. - Continue current medical therapy. - Encourage physical activity, including uphill and downhill walking, to improve heart relaxation. - Switch metoprolol  tartrate to metoprolol  succinate 25 mg once daily to improve compliance as he forgets evening dose and feels palpitations. - Add losartan 25 mg once daily for renal protection. - Monitor for dizziness, especially during physical exertion.  Type 2 diabetes mellitus Management includes Farxiga , metformin , Mounjaro, and insulin  as needed. Recent increase in Mounjaro dose caused gastrointestinal side effects, necessitating a return to the previous dose. Ongoing weight loss is beneficial for diabetes control. Blood pressure medications may contribute to hypotension, requiring monitoring. - Continue current diabetes medications, including Farxiga , metformin , and insulin  as needed. - Return Mounjaro dose to 10 mg due to gastrointestinal side effects. - Encourage weight loss and monitor diabetes control. - Monitor blood pressure and adjust medications as needed.  Hyperlipidemia LDL cholesterol remains elevated at 93 mg/dL despite atorvastatin  80 mg. Goal LDL is less than 55 mg/dL. Addition of Zetia is insufficient, necessitating Repatha. Repatha is an injectable administered every two weeks and can be self-administered at home. - Continue atorvastatin  80 mg once daily. - Start Repatha injections every two weeks. - Check lipid panel in four  weeks.  Goal LDL <54  Addendum: Patient actually had lipid profile performed with Dr. Faythe who is his endocrinologist and these results are entered.  His LDL as of 07/08/2023 is 44 and is at goal.  Hence Repatha will be discontinued.  He will continue with atorvastatin  80 mg daily.  Myocardial infarction Current management focuses on preventing further cardiac events through medical therapy. Emphasis on lifestyle modifications and medication adherence to prevent recurrence. - Continue current cardiac medications and lifestyle modifications. - Encourage regular physical activity to improve cardiac function. Office visit in 6 months.  Will consider discontinuing DAPT at that time.  Time spent in reviewing patient chart, reviewing his labs, coordination of care and management options is 44 minutes  Signed,  Gordy Bergamo, MD, [facility] 08/10/2023, 10:03 AM [facility] [address] [place], [place] [address] Phone: [phone]. Fax:  [phone]

## 2023-08-10 ENCOUNTER — Telehealth: Payer: Self-pay

## 2023-08-10 ENCOUNTER — Other Ambulatory Visit (HOSPITAL_COMMUNITY): Payer: Self-pay

## 2023-08-10 ENCOUNTER — Ambulatory Visit: Attending: Cardiology | Admitting: Cardiology

## 2023-08-10 ENCOUNTER — Other Ambulatory Visit: Payer: Self-pay | Admitting: *Deleted

## 2023-08-10 ENCOUNTER — Encounter: Payer: Self-pay | Admitting: Cardiology

## 2023-08-10 VITALS — BP 100/62 | HR 90 | Resp 16 | Ht 68.0 in | Wt 153.2 lb

## 2023-08-10 DIAGNOSIS — E7800 Pure hypercholesterolemia, unspecified: Secondary | ICD-10-CM

## 2023-08-10 DIAGNOSIS — I25118 Atherosclerotic heart disease of native coronary artery with other forms of angina pectoris: Secondary | ICD-10-CM

## 2023-08-10 DIAGNOSIS — E1165 Type 2 diabetes mellitus with hyperglycemia: Secondary | ICD-10-CM

## 2023-08-10 MED ORDER — METOPROLOL SUCCINATE ER 25 MG PO TB24
25.0000 mg | ORAL_TABLET | Freq: Every day | ORAL | 3 refills | Status: DC
  Filled 2023-08-10: qty 30, 30d supply, fill #0

## 2023-08-10 MED ORDER — REPATHA SURECLICK 140 MG/ML ~~LOC~~ SOAJ
1.0000 mL | SUBCUTANEOUS | 3 refills | Status: DC
  Filled 2023-08-10: qty 2, 28d supply, fill #0

## 2023-08-10 MED ORDER — LOSARTAN POTASSIUM 25 MG PO TABS
25.0000 mg | ORAL_TABLET | Freq: Every day | ORAL | 3 refills | Status: DC
  Filled 2023-08-10: qty 30, 30d supply, fill #0

## 2023-08-10 NOTE — Patient Instructions (Addendum)
 Medication Instructions:  Your physician has recommended you make the following change in your medication:  Stop metoprolol  tartrate Start Metoprolol  succinate 25 mg by mouth daily Start Losartan 25 mg by mouth daily   *If you need a refill on your cardiac medications before your next appointment, please call your pharmacy*  Lab Work: Have fasting lab work done in about 4 weeks (lipids and BMP).  Can be done at any [facility] location.  There is a [facility] on the first floor of our building If you have labs (blood work) drawn today and your tests are completely normal, you will receive your results only by: MyChart Message (if you have MyChart) OR A paper copy in the mail If you have any lab test that is abnormal or we need to change your treatment, we will call you to review the results.  Testing/Procedures: none  Follow-Up: At [facility], you and your health needs are our priority.  As part of our continuing mission to provide you with exceptional heart care, our providers are all part of one team.  This team includes your primary Cardiologist (physician) and Advanced Practice Providers or APPs (Physician Assistants and Nurse Practitioners) who all work together to provide you with the care you need, when you need it.  Your next appointment:   6 month(s)  Provider:   Gordy Bergamo, MD    We recommend signing up for the patient portal called MyChart.  Sign up information is provided on this After Visit Summary.  MyChart is used to connect with patients for Virtual Visits (Telemedicine).  Patients are able to view lab/test results, encounter notes, upcoming appointments, etc.  Non-urgent messages can be sent to your provider as well.   To learn more about what you can do with MyChart, go to [email or web address].   Other Instructions

## 2023-08-10 NOTE — Addendum Note (Signed)
 Addended by: LADONA MILAN on: 08/10/2023 10:03 AM   Modules accepted: Orders

## 2023-08-10 NOTE — Telephone Encounter (Signed)
 Pharmacy Patient Advocate Encounter   Received notification from Patient Pharmacy that prior authorization for REPATHA is required/requested.   Insurance verification completed.   The patient is insured through [facility] .   Per test claim: PA required; PA submitted to above mentioned insurance via CoverMyMeds Key/confirmation #/EOC [id number] Status is pending

## 2023-08-10 NOTE — Telephone Encounter (Signed)
 Clinic reached out to cancel Repatha order.   PA has already been sent to plan but will disregard determination notice.  Pharmacy made aware.

## 2023-08-21 ENCOUNTER — Encounter: Payer: Self-pay | Admitting: Cardiology

## 2023-08-22 ENCOUNTER — Other Ambulatory Visit (HOSPITAL_COMMUNITY): Payer: Self-pay

## 2023-08-22 ENCOUNTER — Other Ambulatory Visit: Payer: Self-pay

## 2023-08-22 MED ORDER — FARXIGA 10 MG PO TABS: 10.0000 mg | ORAL_TABLET | Freq: Every morning | ORAL | 3 refills | Status: DC

## 2023-09-12 ENCOUNTER — Other Ambulatory Visit (HOSPITAL_COMMUNITY): Payer: Self-pay

## 2023-09-29 ENCOUNTER — Other Ambulatory Visit: Payer: Self-pay | Admitting: Family Medicine

## 2023-09-29 DIAGNOSIS — R0609 Other forms of dyspnea: Secondary | ICD-10-CM

## 2023-09-29 DIAGNOSIS — I259 Chronic ischemic heart disease, unspecified: Secondary | ICD-10-CM

## 2023-09-29 NOTE — Progress Notes (Unsigned)
 Persistent low stamina and some SOB since MI. NO Overt CP.  Echo ordered.

## 2023-10-18 ENCOUNTER — Encounter: Payer: Self-pay | Admitting: Cardiology

## 2023-10-18 DIAGNOSIS — I25118 Atherosclerotic heart disease of native coronary artery with other forms of angina pectoris: Secondary | ICD-10-CM

## 2023-10-18 DIAGNOSIS — E1165 Type 2 diabetes mellitus with hyperglycemia: Secondary | ICD-10-CM

## 2023-10-18 MED ORDER — FARXIGA 10 MG PO TABS: 10.0000 mg | ORAL_TABLET | Freq: Every morning | ORAL | 3 refills | Status: AC

## 2023-10-18 MED ORDER — LOSARTAN POTASSIUM 25 MG PO TABS: 25.0000 mg | ORAL_TABLET | Freq: Every day | ORAL | 3 refills | Status: AC

## 2023-10-18 MED ORDER — METOPROLOL SUCCINATE ER 25 MG PO TB24: 25.0000 mg | ORAL_TABLET | Freq: Every day | ORAL | 3 refills | Status: DC

## 2023-11-08 ENCOUNTER — Encounter: Payer: Self-pay | Admitting: Cardiology

## 2023-11-09 ENCOUNTER — Other Ambulatory Visit (HOSPITAL_COMMUNITY): Payer: Self-pay

## 2023-11-09 ENCOUNTER — Telehealth: Payer: Self-pay | Admitting: Pharmacy Technician

## 2023-11-09 NOTE — Telephone Encounter (Signed)
     Now this is free. Sent pt a message

## 2023-12-14 ENCOUNTER — Encounter: Payer: Self-pay | Admitting: Cardiology

## 2023-12-16 ENCOUNTER — Encounter: Payer: Self-pay | Admitting: *Deleted

## 2023-12-16 ENCOUNTER — Ambulatory Visit: Attending: Internal Medicine | Admitting: Internal Medicine

## 2023-12-16 ENCOUNTER — Encounter: Payer: Self-pay | Admitting: Internal Medicine

## 2023-12-16 VITALS — BP 114/68 | HR 94 | Ht 68.0 in | Wt 160.4 lb

## 2023-12-16 DIAGNOSIS — I252 Old myocardial infarction: Secondary | ICD-10-CM

## 2023-12-16 DIAGNOSIS — I25118 Atherosclerotic heart disease of native coronary artery with other forms of angina pectoris: Secondary | ICD-10-CM | POA: Diagnosis not present

## 2023-12-16 DIAGNOSIS — E039 Hypothyroidism, unspecified: Secondary | ICD-10-CM

## 2023-12-16 DIAGNOSIS — R002 Palpitations: Secondary | ICD-10-CM

## 2023-12-16 DIAGNOSIS — E782 Mixed hyperlipidemia: Secondary | ICD-10-CM

## 2023-12-16 NOTE — Patient Instructions (Addendum)
 Medication Instructions:  Your physician recommends that you continue on your current medications as directed. Please refer to the Current Medication list given to you today.  *If you need a refill on your cardiac medications before your next appointment, please call your pharmacy*  Lab Work: none If you have labs (blood work) drawn today and your tests are completely normal, you will receive your results only by: MyChart Message (if you have MyChart) OR A paper copy in the mail If you have any lab test that is abnormal or we need to change your treatment, we will call you to review the results.  Testing/Procedures: Dr Kriste has requested you wear a zio monitor for 3 days  Follow-Up: At [facility], you and your health needs are our priority.  As part of our continuing mission to provide you with exceptional heart care, our providers are all part of one team.  This team includes your primary Cardiologist (physician) and Advanced Practice Providers or APPs (Physician Assistants and Nurse Practitioners) who all work together to provide you with the care you need, when you need it.  Your next appointment:    1/30 at 8 AM Provider:   Gordy Bergamo, MD    We recommend signing up for the patient portal called MyChart.  Sign up information is provided on this After Visit Summary.  MyChart is used to connect with patients for Virtual Visits (Telemedicine).  Patients are able to view lab/test results, encounter notes, upcoming appointments, etc.  Non-urgent messages can be sent to your provider as well.   To learn more about what you can do with MyChart, go to [email or web address].   Other Instructions  ZIO XT- Long Term Monitor Instructions  Your physician has requested you wear a ZIO patch monitor for 14 days.  This is a single patch monitor. [employer] supplies one patch monitor per enrollment. Additional stickers are not available. Please do not apply patch if you will be having a  Nuclear Stress Test,  Echocardiogram, Cardiac CT, MRI, or Chest Xray during the period you would be wearing the  monitor. The patch cannot be worn during these tests. You cannot remove and re-apply the  ZIO XT patch monitor.  Your ZIO patch monitor will be mailed 3 day USPS to your address on file. It may take 3-5 days  to receive your monitor after you have been enrolled.  Once you have received your monitor, please review the enclosed instructions. Your monitor  has already been registered assigning a specific monitor serial # to you.  Billing and Patient Assistance Program Information  We have supplied [employer] with any of your insurance information on file for billing purposes. [employer] offers a sliding scale Patient Assistance Program for patients that do not have  insurance, or whose insurance does not completely cover the cost of the ZIO monitor.  You must apply for the Patient Assistance Program to qualify for this discounted rate.  To apply, please call [employer] at [phone], select option 4, select option 2, ask to apply for  Patient Assistance Program. Meredeth will ask your household income, and how many people  are in your household. They will quote your out-of-pocket cost based on that information.  [employer] will also be able to set up a 80-month, interest-free payment plan if needed.  Applying the monitor   Shave hair from upper left chest.  Hold abrader disc by orange tab. Rub abrader in 40 strokes over the upper left chest as  indicated in  your monitor instructions.  Clean area with 4 enclosed alcohol pads. Let dry.  Apply patch as indicated in monitor instructions. Patch will be placed under collarbone on left  side of chest with arrow pointing upward.  Rub patch adhesive wings for 2 minutes. Remove white label marked 1. Remove the white  label marked 2. Rub patch adhesive wings for 2 additional minutes.  While looking in a mirror, press and release button in center  of patch. A small green light will  flash 3-4 times. This will be your only indicator that the monitor has been turned on.  Do not shower for the first 24 hours. You may shower after the first 24 hours.  Press the button if you feel a symptom. You will hear a small click. Record Date, Time and  Symptom in the Patient Logbook.  When you are ready to remove the patch, follow instructions on the last 2 pages of Patient  Logbook. Stick patch monitor onto the last page of Patient Logbook.  Place Patient Logbook in the blue and white box. Use locking tab on box and tape box closed  securely. The blue and white box has prepaid postage on it. Please place it in the mailbox as  soon as possible. Your physician should have your test results approximately 7 days after the  monitor has been mailed back to [facility].  Call [facility] Customer Care at [phone] if you have questions regarding  your ZIO XT patch monitor. Call them immediately if you see an orange light blinking on your  monitor.  If your monitor falls off in less than 4 days, contact our Monitor department at [phone].  If your monitor becomes loose or falls off after 4 days call [employer] at [phone] for  suggestions on securing your monitor

## 2023-12-16 NOTE — Progress Notes (Signed)
 Cardiology Office Note   Date:  12/16/2023  ID:  Ryan Decker, DOB November 19, 1965, MRN [id number] PCP: Remonia Alm PARAS, MD  [facility] Providers Cardiologist:  Gordy Bergamo, MD     History of Present Illness Ryan Decker is a 58 y.o. male who presents with his wife and primarily sees Dr. Bergamo with a past medical history of hypertension, hyperlipidemia, diabetes, inferior STEMI in March 2025 s/p angioplasty to RCA and proximal and distal LAD in a staged fashion on the following day with known CTO of circumflex, echocardiogram on 05/03/2023 showed inferolateral hypokinesis with an EF of 55% who presents today for an acute visit for palpitations.  He was most recently seen by Dr. Bergamo on 08/10/2023 at which time he complained of intermittent chest pain which was thought to be musculoskeletal.  He was started on losartan  25 mg daily and metoprolol  tartrate was switched to metoprolol  succinate 25 mg.  His Mounjaro was changed to 10 mg due to GI side effects and his Repatha  was discontinued as his LDL was at goal and he was continued on atorvastatin  and Zetia.  Today, he states that Tuesday morning he woke up with sudden onset palpitations/pounding in his chest.  Since that time it has been relatively constant but has increased and decreased in severity.  He has not had any syncopal episodes.  He has also had symptoms of diaphoresis and hot flashes throughout this period of time.  Of note, since he had his MI in March he has been noted to be hyperthyroid while on levothyroxine  and he was medications have been adjusted.  Most recently his TSH was 0.1 and his levothyroxine  was decreased to 88 mcg on 10/17, prior to the onset of his symptoms.  He has been having occasional chest pain but this does not have any association with exertion and is brief and not similar to prior cardiac pain.  No infectious type symptoms.    ROS:  Review of Systems  All other systems reviewed and are  negative.   Physical Exam  Physical Exam Vitals and nursing note reviewed.  Constitutional:      Appearance: Normal appearance.  HENT:     Head: Normocephalic and atraumatic.  Eyes:     Conjunctiva/sclera: Conjunctivae normal.  Neck:     Vascular: No carotid bruit.  Cardiovascular:     Rate and Rhythm: Normal rate and regular rhythm.  Pulmonary:     Effort: Pulmonary effort is normal.     Breath sounds: Normal breath sounds.  Musculoskeletal:        General: No swelling or tenderness.  Skin:    Coloration: Skin is not jaundiced or pale.  Neurological:     Mental Status: He is alert.     VS:  BP 114/68 (BP Location: Left Arm, Patient Position: Sitting, Cuff Size: Normal)   Pulse 94   Ht 5' 8 (1.727 m)   Wt 160 lb 6.4 oz (72.8 kg)   SpO2 97%   BMI 24.39 kg/m         Wt Readings from Last 3 Encounters:  12/16/23 160 lb 6.4 oz (72.8 kg)  08/10/23 153 lb 3.2 oz (69.5 kg)  05/10/23 167 lb (75.8 kg)     EKG Interpretation Date/Time:  Friday December 16 2023 15:36:19 EDT Ventricular Rate:  94 PR Interval:  148 QRS Duration:  84 QT Interval:  368 QTC Calculation: 460 R Axis:   49  Text Interpretation: Normal sinus rhythm  Inferior infarct ,  age undetermined When compared with ECG of 10-May-2023 08:19,  No significant change since  Confirmed by Kriste Hicks [phone]) on 12/16/2023 3:53:51 PM    Studies Reviewed        Risk Assessment/Calculations      ASSESSMENT  Palpitations currently in normal sinus rhythm.  Likely due to hyperthyroid state vs a 'brewing' viral illness vs less likely arrhythmia as he is complaining of palpitations today and an EKG showed normal sinus rhythm and on exam he had regular rate and rhythm.  However he may have an underlying rhythm given his hyperthyroid state. currently on metoprolol  to tartrate 25 mg twice daily CAD and inferior STEMI in March 2025 s/p angioplasty to RCA and proximal and distal LAD in a staged fashion on  the following day with known CTO of circumflex compliant with dual antiplatelet therapy on aspirin  and prasugrel , beta-blocker, isosorbide mononitrate 30 mg and high intensity statin.  No recurrent exertional chest pain Noncardiac chest pain, likely due to his hiatal hernia or musculoskeletal Hypertension Hyperlipidemia Hypothyroidism and currently hyperthyroid state being managed by endocrinology who is decreasing his levothyroxine  dose   Plan  Short-term Zio patch to evaluate for arrhythmias will hold off on ordering an echocardiogram at this time Recommend continued management of his hypothyroidism/hyperthyroidism  Follow up: 2 to 3 months for his 37-month follow-up with his primary cardiologist          Signed, Hicks FORBES Kriste, MD

## 2023-12-17 ENCOUNTER — Other Ambulatory Visit: Payer: Self-pay | Admitting: Cardiology

## 2023-12-19 ENCOUNTER — Ambulatory Visit: Attending: Internal Medicine

## 2023-12-19 DIAGNOSIS — R002 Palpitations: Secondary | ICD-10-CM

## 2023-12-19 NOTE — Progress Notes (Unsigned)
 Enrolled for [employer] to mail a ZIO XT long term holter monitor to the patients address on file.  Requested delivery date of 12/23/23.

## 2023-12-20 NOTE — Telephone Encounter (Signed)
 Pt's pharmacy is requesting a refill on non cardiac medication prasugrel . Would Dr. Kriste like to refill this non cardiac medication? Please address

## 2024-01-05 ENCOUNTER — Ambulatory Visit: Payer: Self-pay | Admitting: Internal Medicine

## 2024-01-05 DIAGNOSIS — R002 Palpitations: Secondary | ICD-10-CM | POA: Diagnosis not present

## 2024-01-11 ENCOUNTER — Ambulatory Visit: Admitting: Cardiology

## 2024-01-11 NOTE — Telephone Encounter (Signed)
 Called the patient and did not receive an answer. I left a message with the heart monitor results and Dr. Ali comments.   Per Dr. Kriste, Your heart monitor did not show any concerning findings. Your triggered symptoms corresponded to normal rhythm and you did not have any concerning arrhythmias. How are your palpitations?

## 2024-03-15 ENCOUNTER — Encounter: Payer: Self-pay | Admitting: Cardiology

## 2024-03-15 NOTE — Progress Notes (Signed)
 " Cardiology Office Note:  .   Date:  03/16/2024  ID:  Ryan Decker, DOB January 28, 1966, MRN [id number] PCP: Remonia Alm PARAS, MD  [facility] Providers Cardiologist:  Gordy Bergamo, MD   History of Present Illness: .   Ryan Decker is a 59 y.o.  Caucasian male hypertension, hypercholesterolemia, diabetes mellitus patient with inferior STEMI in March 2025 and underwent angioplasty to RCA and proximal and distal LAD in a staged fashion the following day and has known CTO mid small Cx, echocardiogram on 05/03/2023 revealing inferolateral hypokinesis, EF 55%.  He has noticed nightmares, sensitivity to cold, wakes up with heart pounding heart, underwent event monitor which showed only normal sinus rhythm.  He has had occasional chest discomfort.  Overall he is doing well and is trying to increase his physical activity.  He has noticed mild dizziness.    Discussed the use of AI scribe software for clinical note transcription with the patient, who gave verbal consent to proceed.  History of Present Illness Ryan Decker is a 59 year old male with coronary artery disease and diabetes who presents with heart palpitations and cold sensitivity.  He has recurring morning palpitations with a pounding heartbeat. Prior cardiac monitoring reportedly showed no arrhythmia. His Mounjaro dose for diabetes was reduced from 10 mg to 2.5 mg in an attempt to lessen palpitations, and he notes some recent decrease in symptoms.  He has marked cold sensitivity when ambient temperatures are below about 73-74 degrees, especially in his hands and extremities, which he attributes to metoprolol . He wears mittens for comfort.  Two days ago he had transient chest pain described as a sensation of swallowing an air bubble. During that episode his right arm felt very cold without numbness, and felt significantly colder than his left arm.  He has coronary artery disease with prior catheterization showing occlusions in the  right coronary artery and circumflex, and stents in the right coronary artery and LAD. He recalls being told his heart function was normal on a recent echocardiogram.  He takes metoprolol  25 mg and is worried about low blood pressure and related symptoms, with a recent reading of 100/60. His diabetes is treated with Mounjaro, and his most recent A1c is 7.8%. He is working on diet and increasing aerobic exercise using a gym at work.  Cardiac Studies relevent.    CARDIAC CATHETERIZATION 05/04/2023                                  3 x 28 mm and 3 x 12 mm overlapping DES to RCA LHC 05/02/23: LAD proximal 99, mid 60, distal 80, 80, 95, D1 60; LCx 100 CTO with L-L collaterals; RCA proximal 99, mid 70, distal 85, RPDA 99, RPAV 99 S/p staged PCI 05/04/2023: 2.75 x 34 mm DES to the proximal LAD, 2 x 15 mm DES to the distal LAD  Echocardiogram 05/03/2023: Inferior and inferolateral wall hypokinesis, low normal LVEF at 50 to 55%.  Grade 1 diastolic dysfunction.  EKG:      Labs   Care everywhere/Faxed External Labs:  Labs 03/01/2024:  Serum glucose 156, BUN 18, creatinine 0.76, eGFR 104 mLPotassium 5.1, LFTs normal.  Total cholesterol 119, triglycerides 150, HDL 45, LDL 48.  TSH normal at 2.410.  A1c 7.8%.  Labs 07/08/2023:  Total cholesterol 105, triglycerides 147, HDL 35, LDL 44.  Serum glucose 121, BUN 16, creatinine 0.69, eGFR 107 mL, potassium 4.2,  LFTs normal.  TSH normal at 0.68.  A1c 7.0%.  ROS  Review of Systems  Cardiovascular:  Positive for palpitations. Negative for chest pain, dyspnea on exertion and leg swelling.   Physical Exam:   VS:  BP 100/60 (BP Location: Right Arm, Patient Position: Sitting, Cuff Size: Normal)   Pulse 84   Ht 5' 4 (1.626 m)   Wt 170 lb (77.1 kg)   SpO2 98%   BMI 29.18 kg/m    Wt Readings from Last 3 Encounters:  03/16/24 170 lb (77.1 kg)  12/16/23 160 lb 6.4 oz (72.8 kg)  08/10/23 153 lb 3.2 oz (69.5 kg)    BP Readings from Last 3  Encounters:  03/16/24 100/60  12/16/23 114/68  08/10/23 100/62   Physical Exam Neck:     Vascular: No carotid bruit or JVD.  Cardiovascular:     Rate and Rhythm: Normal rate and regular rhythm.     Pulses: Intact distal pulses.          Dorsalis pedis pulses are 1+ on the right side and 1+ on the left side.       Posterior tibial pulses are 1+ on the right side and 1+ on the left side.     Heart sounds: Normal heart sounds. No murmur heard.    No gallop.  Pulmonary:     Effort: Pulmonary effort is normal.     Breath sounds: Normal breath sounds.  Abdominal:     General: Bowel sounds are normal.     Palpations: Abdomen is soft.  Musculoskeletal:     Right lower leg: No edema.     Left lower leg: No edema.    ASSESSMENT AND PLAN: .      ICD-10-CM   1. Coronary artery disease of native artery of native heart with stable angina pectoris  I25.118     2. Pure hypercholesterolemia  E78.00     3. Type 2 diabetes mellitus without complication, without long-term current use of insulin  (HCC)  E11.9      Assessment & Plan Coronary artery disease with stable angina Coronary artery disease with previous catheterization showing occlusion of the right coronary artery and circumflex artery, with stents in the right coronary and LAD. Circumflex artery occlusion with collateral circulation. Echocardiogram shows normal heart function. Occasional chest pain anticipated with activity. No need for further cardiac testing at this time. - Encouraged aerobic activities such as using a stationary bicycle or elliptical. - Advised to engage in yard work and physical activities as tolerated.  Type 2 diabetes mellitus with Hyperglycemia Type 2 diabetes with peripheral neuropathy. Hemoglobin A1c is 7.8%. Previous reduction in Mounjaro dose due to concerns about gastric side effects. Plan to increase Mounjaro dose to improve glycemic control. Peripheral neuropathy may be related to longstanding diabetes  and medication effects. - Increase Mounjaro dose to improve glycemic control. - Monitor for improvement in symptoms and glycemic control.  Pure hypercholesterolemia Cholesterol levels slightly elevated, attributed to recent holiday indulgences. Current levels are not concerning. - Continue current management and monitor cholesterol levels. - Lipids are at target  Adverse effects of beta blocker therapy Adverse effects from metoprolol  include cold intolerance, nightmares, and peripheral vasoconstriction. Decision made to discontinue metoprolol  to assess improvement in symptoms. - Discontinued metoprolol . - Monitor blood pressure and symptoms after discontinuation. - Blood pressure is very soft at 100 mmHg.  Follow up: 6 months for CAD, Hyperlipidemia, DM  Signed,  Gordy Bergamo, MD, [facility] 03/16/2024, 8:29 AM [facility] [address] [place], [place] [address] Phone: [phone]. Fax:  [phone]  "

## 2024-03-16 ENCOUNTER — Ambulatory Visit: Attending: Cardiology | Admitting: Cardiology

## 2024-03-16 ENCOUNTER — Encounter: Payer: Self-pay | Admitting: Cardiology

## 2024-03-16 VITALS — BP 100/60 | HR 84 | Ht 64.0 in | Wt 170.0 lb

## 2024-03-16 DIAGNOSIS — I25118 Atherosclerotic heart disease of native coronary artery with other forms of angina pectoris: Secondary | ICD-10-CM | POA: Diagnosis not present

## 2024-03-16 DIAGNOSIS — E119 Type 2 diabetes mellitus without complications: Secondary | ICD-10-CM | POA: Diagnosis not present

## 2024-03-16 DIAGNOSIS — E7800 Pure hypercholesterolemia, unspecified: Secondary | ICD-10-CM | POA: Diagnosis not present

## 2024-03-16 NOTE — Patient Instructions (Addendum)
 Medication Instructions:   STOP  *If you need a refill on your cardiac medications before your next appointment, please call your pharmacy*    Follow-Up: At [facility], you and your health needs are our priority.  As part of our continuing mission to provide you with exceptional heart care, our providers are all part of one team.  This team includes your primary Cardiologist (physician) and Advanced Practice Providers or APPs (Physician Assistants and Nurse Practitioners) who all work together to provide you with the care you need, when you need it.  Your next appointment:   6 month(s)  Provider:   Gordy Bergamo, MD           We recommend signing up for the patient portal called MyChart.  Patients are able to view lab/test results, encounter notes, upcoming appointments, etc.  Non-urgent messages can be sent to your provider as well, go to [email or web address].
